# Patient Record
Sex: Female | Born: 1990 | Race: White | Hispanic: No | Marital: Married | Smoking: Never smoker
Health system: Southern US, Community
[De-identification: ages and names within clinical notes are randomized; demographics above are authoritative.]

## PROBLEM LIST (undated history)

## (undated) ENCOUNTER — Inpatient Hospital Stay (HOSPITAL_COMMUNITY): Payer: Self-pay

## (undated) HISTORY — PX: WISDOM TOOTH EXTRACTION: SHX21

---
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
## 2010-11-09 ENCOUNTER — Encounter: Payer: Self-pay | Admitting: *Deleted

## 2011-08-04 LAB — DIFFERENTIAL
Basophils Absolute: 0
Basophils Relative: 0
Eosinophils Absolute: 0.3
Eosinophils Relative: 4
Lymphocytes Relative: 26
Lymphs Abs: 1.7
Monocytes Absolute: 0.5
Monocytes Relative: 8
Neutro Abs: 3.9
Neutrophils Relative %: 61

## 2011-08-04 LAB — PREGNANCY, URINE: Preg Test, Ur: NEGATIVE

## 2011-08-04 LAB — COMPREHENSIVE METABOLIC PANEL
ALT: 13
AST: 20
Albumin: 3.9
Alkaline Phosphatase: 84
BUN: 7
CO2: 27
Calcium: 10.2
Chloride: 107
Creatinine, Ser: 0.74
Glucose, Bld: 105 — ABNORMAL HIGH
Potassium: 3.9
Sodium: 142
Total Bilirubin: 0.4
Total Protein: 6.9

## 2011-08-04 LAB — URINALYSIS, ROUTINE W REFLEX MICROSCOPIC
Bilirubin Urine: NEGATIVE
Glucose, UA: NEGATIVE
Hgb urine dipstick: NEGATIVE
Ketones, ur: NEGATIVE
Nitrite: NEGATIVE
Protein, ur: NEGATIVE
Specific Gravity, Urine: 1.015
Urobilinogen, UA: 0.2
pH: 6

## 2011-08-04 LAB — CBC
HCT: 34.8 — ABNORMAL LOW
Hemoglobin: 12.2
MCHC: 34.9
MCV: 88.4
Platelets: 284
RBC: 3.94
RDW: 11.8
WBC: 6.3

## 2011-11-08 ENCOUNTER — Emergency Department (HOSPITAL_COMMUNITY)
Admission: EM | Admit: 2011-11-08 | Discharge: 2011-11-08 | Disposition: A | Discharge status: Home / Self Care | Payer: Self-pay | Source: Home / Self Care | Attending: Emergency Medicine | Admitting: Emergency Medicine

## 2011-11-08 ENCOUNTER — Encounter (HOSPITAL_COMMUNITY): Payer: Self-pay | Admitting: *Deleted

## 2011-11-08 DIAGNOSIS — H169 Unspecified keratitis: Secondary | ICD-10-CM

## 2011-11-08 DIAGNOSIS — S0501XA Injury of conjunctiva and corneal abrasion without foreign body, right eye, initial encounter: Secondary | ICD-10-CM

## 2011-11-08 DIAGNOSIS — S058X9A Other injuries of unspecified eye and orbit, initial encounter: Secondary | ICD-10-CM

## 2011-11-08 MED ORDER — CIPROFLOXACIN HCL 0.3 % OP SOLN: 1.0000 [drp] | OPHTHALMIC | Status: AC

## 2011-11-08 MED ORDER — TETRACAINE HCL 0.5 % OP SOLN
OPHTHALMIC | Status: AC
  Filled 2011-11-08: qty 2

## 2011-11-08 MED ORDER — HYDROCODONE-ACETAMINOPHEN 5-500 MG PO TABS: 1.0000 | ORAL_TABLET | Freq: Four times a day (QID) | ORAL | Status: AC | PRN

## 2011-11-08 NOTE — ED Provider Notes (Signed)
History     CSN: [id number]  Arrival date & time 11/08/11  1243   First MD Initiated Contact with Patient 11/08/11 1422      Chief Complaint  Patient presents with  . Eye Problem    (Consider location/radiation/quality/duration/timing/severity/associated sxs/prior treatment) HPI Comments: WENT to [facility], they sent here for an eye exam" " They didn't even looked with that (points to ophthalmoscope) THEY TOLD TO COME HERE CAUSE IT COULD BE A "DEEP INFECTION" Thursday I woke up fine, but shortly after putting my contact lenses, my eyes started to turned red and noticed more irritation and redness around my eye, started to feel pressure type pain on both eyes, and light sensitivity" " Have also feltlike nauseous", The soreness and pain is worse now, I have been using like artificial tears"       Patient is a 21 y.o. female presenting with eye problem.  Eye Problem  This is a new problem. The current episode started more than 2 days ago. The problem occurs constantly. The problem has been gradually worsening. There is pain in both eyes. The injury mechanism was contact lenses. The pain is at a severity of 4/10. The pain is moderate. There is no history of trauma to the eye. There is no known exposure to pink eye. She wears contacts. Associated symptoms include foreign body sensation, photophobia, eye redness, nausea and itching. Pertinent negatives include no numbness, no decreased vision, no discharge, no double vision and no tingling. The treatment provided no relief.    History reviewed. No pertinent past medical history.  History reviewed. No pertinent past surgical history.  No family history on file.  History  Substance Use Topics  . Smoking status: Never Smoker   . Smokeless tobacco: Not on file  . Alcohol Use: No    OB History    Grav Para Term Preterm Abortions TAB SAB Ect Mult Living                  Review of Systems  Constitutional: Negative for  fever and activity change.  HENT: Negative for ear pain, rhinorrhea, neck pain and neck stiffness.   Eyes: Positive for photophobia, pain, redness and itching. Negative for double vision, discharge and visual disturbance.  Gastrointestinal: Positive for nausea. Negative for abdominal pain and diarrhea.  Skin: Positive for itching. Negative for rash.  Neurological: Negative for tingling and numbness.    Allergies  Review of patient's allergies indicates no known allergies.  Home Medications   Current Outpatient Rx  Name Route Sig Dispense Refill  . NORGESTIMATE-ETH ESTRADIOL 0.25-35 MG-MCG PO TABS Oral Take 1 tablet by mouth daily.    Marland Kitchen CIPROFLOXACIN HCL 0.3 % OP SOLN Both Eyes Place 1 drop into both eyes every 2 (two) hours. Administer 1 drop, every 2 hours, while awake, for 2 days. Then 1 drop, every 4 hours, while awake, for the next 5 days. 5 mL 0  . HYDROCODONE-ACETAMINOPHEN 5-500 MG PO TABS Oral Take 1 tablet by mouth every 6 (six) hours as needed for pain. 30 tablet 0    BP 105/63  Pulse 69  Temp(Src) 98.2 F (36.8 C) (Oral)  Resp 16  SpO2 100%  LMP 10/30/2011  Physical Exam  Nursing note and vitals reviewed. Constitutional: She is oriented to person, place, and time. She appears well-developed and well-nourished. No distress.  HENT:  Head: Atraumatic.  Eyes: EOM are normal. Pupils are equal, round, and reactive to light. No foreign bodies found.  Right eye exhibits no exudate. No foreign body present in the right eye. Left eye exhibits no discharge and no exudate. No foreign body present in the left eye. Right conjunctiva is injected. Right conjunctiva has no hemorrhage. Left conjunctiva is injected. Left conjunctiva has no hemorrhage. Pupils are equal.  Slit lamp exam:      The right eye shows corneal abrasion, corneal flare and fluorescein uptake. The right eye shows no corneal ulcer, no foreign body, no hyphema and no anterior chamber bulge.       The left eye shows  corneal abrasion, corneal flare and fluorescein uptake. The left eye shows no corneal ulcer, no foreign body, no hyphema, no hypopyon and no anterior chamber bulge.    Neck: Normal range of motion. Neck supple.  Neurological: She is alert and oriented to person, place, and time.  Skin: Skin is warm. No rash noted. No erythema.    ED Course  Procedures (including critical care time)  Labs Reviewed - No data to display No results found.   1. Bilateral corneal abrasions   2. Keratitis       MDM  Bilateral corneal opacifications, Follow-up in the ED if any worsening symptoms, for eye measuring pressure. Aggressive antibiotic treatment today.        Jimmie Molly, MD 11/08/11 [phone]

## 2011-11-08 NOTE — ED Notes (Addendum)
Reports waking up Thurs morning without any problems; after putting in contact lenses, started to notice slight redness in eye and "red ring around left eye", and noticing "black splotches".  Redness and irritation have gotten progressively worse over last couple of days; denies drainage.  C/O "a lot of pressure" and light sensitivity in bilat eyes.  Went to another urgent care and was sent here for lamp exam.

## 2011-11-10 ENCOUNTER — Telehealth (HOSPITAL_COMMUNITY): Payer: Self-pay | Admitting: *Deleted

## 2013-03-21 LAB — OB RESULTS CONSOLE HEPATITIS B SURFACE ANTIGEN: Hepatitis B Surface Ag: NEGATIVE

## 2013-03-21 LAB — OB RESULTS CONSOLE ABO/RH: RH Type: NEGATIVE

## 2013-03-21 LAB — OB RESULTS CONSOLE RUBELLA ANTIBODY, IGM: Rubella: IMMUNE

## 2013-03-21 LAB — OB RESULTS CONSOLE HIV ANTIBODY (ROUTINE TESTING): HIV: NONREACTIVE

## 2013-03-21 LAB — OB RESULTS CONSOLE RPR: RPR: NONREACTIVE

## 2013-07-15 ENCOUNTER — Inpatient Hospital Stay (HOSPITAL_COMMUNITY)
Admission: AD | Admit: 2013-07-15 | Discharge: 2013-07-15 | Disposition: A | Discharge status: Home / Self Care | Payer: Medicaid Other | Source: Ambulatory Visit | Attending: Obstetrics and Gynecology | Admitting: Obstetrics and Gynecology

## 2013-07-15 ENCOUNTER — Encounter (HOSPITAL_COMMUNITY): Payer: Self-pay | Admitting: *Deleted

## 2013-07-15 ENCOUNTER — Encounter: Payer: Self-pay | Admitting: Obstetrics and Gynecology

## 2013-07-15 DIAGNOSIS — W108XXA Fall (on) (from) other stairs and steps, initial encounter: Secondary | ICD-10-CM | POA: Insufficient documentation

## 2013-07-15 DIAGNOSIS — O99891 Other specified diseases and conditions complicating pregnancy: Secondary | ICD-10-CM | POA: Insufficient documentation

## 2013-07-15 DIAGNOSIS — Z298 Encounter for other specified prophylactic measures: Secondary | ICD-10-CM | POA: Insufficient documentation

## 2013-07-15 DIAGNOSIS — Z2989 Encounter for other specified prophylactic measures: Secondary | ICD-10-CM | POA: Insufficient documentation

## 2013-07-15 DIAGNOSIS — Y92009 Unspecified place in unspecified non-institutional (private) residence as the place of occurrence of the external cause: Secondary | ICD-10-CM | POA: Insufficient documentation

## 2013-07-15 LAB — URINALYSIS, ROUTINE W REFLEX MICROSCOPIC
Bilirubin Urine: NEGATIVE
Glucose, UA: NEGATIVE mg/dL
Hgb urine dipstick: NEGATIVE
Ketones, ur: NEGATIVE mg/dL
Leukocytes, UA: NEGATIVE
Nitrite: NEGATIVE
Protein, ur: NEGATIVE mg/dL
Specific Gravity, Urine: 1.025 (ref 1.005–1.030)
Urobilinogen, UA: 0.2 mg/dL (ref 0.0–1.0)
pH: 6 (ref 5.0–8.0)

## 2013-07-15 LAB — KLEIHAUER-BETKE STAIN
# Vials RhIg: 1
Fetal Cells %: 0 %
Quantitation Fetal Hemoglobin: 0 mL

## 2013-07-15 MED ORDER — IBUPROFEN 800 MG PO TABS: 800.0000 mg | ORAL_TABLET | Freq: Three times a day (TID) | ORAL | Status: DC | PRN

## 2013-07-15 MED ORDER — RHO D IMMUNE GLOBULIN 1500 UNIT/2ML IJ SOLN
300.0000 ug | Freq: Once | INTRAMUSCULAR | Status: AC
  Administered 2013-07-15: 300 ug via INTRAMUSCULAR
  Filled 2013-07-15: qty 2

## 2013-07-15 NOTE — MAU Note (Signed)
Patient presents to [facility] after having a fall this am at 8:25 down 7 steps. Patient denies hitting abdomen. Denies bleeding, LOF, or contractions. Reports good fetal movement.

## 2013-07-15 NOTE — Consult Note (Signed)
DATE: 07/15/2013  Maternity Admissions Unit History and Physical Exam for an Obstetrics Patient  Ms. Karen Bishop is a 22 y.o. female, G1P0, at [redacted]w[redacted]d gestation, who presents for evaluation after falling on the stairs. The patient reports that she fell on her right hip. She also hit her elbow on the stairs. She did not hit her head or her abdomen. She denies bleeding and leakage of fluid. She denies uterine contractions. She does not complain of headaches. She does not think that any bones are broken. She has been followed at the [facility].  See history below.  OB History   Grav Para Term Preterm Abortions TAB SAB Ect Mult Living   1               History reviewed. No pertinent past medical history.  Prescriptions prior to admission  Medication Sig Dispense Refill  . Prenatal Vit-Fe Fumarate-FA (PRENATAL MULTIVITAMIN) TABS tablet Take 1 tablet by mouth daily at 12 noon.        Past Surgical History  Procedure Laterality Date  . Wisdom tooth extraction      No Known Allergies  Family History: family history includes Mental illness in her mother.  Social History:  reports that she has never smoked. She does not have any smokeless tobacco history on file. She reports that she does not drink alcohol or use illicit drugs.  Review of systems: Normal pregnancy complaints.  Admission Physical Exam:  Dilation: Closed Exam by:: Dr. Stefano Gaul Body mass index is 30.89 kg/(m^2).  Blood pressure 107/58, pulse 75, temperature 98 F (36.7 C), temperature source Oral, resp. rate 18, height 5\' 1"  (1.549 m), weight 163 lb 6.4 oz (74.118 kg), last menstrual period 01/23/2013, SpO2 100.00%.  HEENT:                 Within normal limits Chest:                   Clear Heart:                    Regular rate and rhythm Abdomen:             Gravid and nontender Extremities:          Grossly normal Neurologic exam: Grossly  normal Pelvic exam:         Cervix: Closed and long, no fluid, no blood. Back:                    Nontender.  Prenatal labs: ABO, Rh:             --/--/O NEG (09/26 0981) Antibody:              NEG (09/26 0918) Rubella:                  RPR:                       HBsAg:                    HIV:                          GBS:                        NST: Category stable fetal heart tone; Contractions:  None .   Assessment:  [redacted]w[redacted]d gestation  The patient fell down the stairs. No evidence of trauma to mother or baby.  Plan:  I will give the patient RhoGAM today because she will receive RhoGAM anyway in 3 weeks.  The patient will call for questions or concerns.  Fetal kick counts will be done.  Return office in one week.  A note was given to the patient so that she can take the day off from work. She can take Motrin 800 mg every 8 hours as needed for discomfort.   [name] V 07/15/2013, 10:26 AM

## 2013-07-16 LAB — RH IG WORKUP (INCLUDES ABO/RH)
ABO/RH(D): O NEG
Antibody Screen: NEGATIVE
Gestational Age(Wks): 24.5
Unit division: 0

## 2013-10-08 LAB — OB RESULTS CONSOLE GBS: GBS: NEGATIVE

## 2013-10-08 LAB — OB RESULTS CONSOLE GC/CHLAMYDIA
Chlamydia: NEGATIVE
Gonorrhea: NEGATIVE

## 2013-10-20 NOTE — L&D Delivery Note (Signed)
Delivery Note Pt progressed quickly and at 1800 pt with involuntary urge to push.  At 6:18 PM a viable female was delivered via Vaginal, Spontaneous Delivery (Presentation: Left Occiput Anterior). Nuchal cord x 1, easily reduced.  No difficulty with shoulders. Infant with spontaneous cry.  Infant dried, stimulated and placed on maternal chest.  Cord doubly clamped after cessation of pulsation.  Cord cut by FOB.   APGAR: 8, 9; weight 7# 10oz .   Placenta status: Intact, Spontaneous.  Cord: 3 vessels with the following complications: None.  Cord pH: N/A  Anesthesia: 1% Xylocaine local for perineal repair Episiotomy: None Lacerations: 2nd degree;Perineal Suture Repair: 3.0 monocryl Est. Blood Loss (mL): 350ml  Mom to postpartum.  Baby to Couplet care / Skin to Skin.  Karen Bishop O. 11/05/2013, 7:23 PM

## 2013-11-04 ENCOUNTER — Encounter (HOSPITAL_COMMUNITY): Payer: Self-pay | Admitting: *Deleted

## 2013-11-04 ENCOUNTER — Inpatient Hospital Stay (HOSPITAL_COMMUNITY)
Admission: AD | Admit: 2013-11-04 | Discharge: 2013-11-04 | Disposition: A | Discharge status: Home / Self Care | Payer: Medicaid Other | Source: Ambulatory Visit | Attending: Obstetrics and Gynecology | Admitting: Obstetrics and Gynecology

## 2013-11-04 DIAGNOSIS — O479 False labor, unspecified: Secondary | ICD-10-CM | POA: Insufficient documentation

## 2013-11-04 MED ORDER — ZOLPIDEM TARTRATE 10 MG PO TABS: 15.0000 mg | ORAL_TABLET | Freq: Every evening | ORAL | Status: DC | PRN

## 2013-11-04 NOTE — Discharge Instructions (Signed)
Fetal Movement Counts °Patient Name: __________________________________________________ Patient Due Date: ____________________ °Performing a fetal movement count is highly recommended in high-risk pregnancies, but it is good for every pregnant woman to do. Your caregiver may ask you to start counting fetal movements at 28 weeks of the pregnancy. Fetal movements often increase: °· After eating a full meal. °· After physical activity. °· After eating or drinking something sweet or cold. °· At rest. °Pay attention to when you feel the baby is most active. This will help you notice a pattern of your baby's sleep and wake cycles and what factors contribute to an increase in fetal movement. It is important to perform a fetal movement count at the same time each day when your baby is normally most active.  °HOW TO COUNT FETAL MOVEMENTS °1. Find a quiet and comfortable area to sit or lie down on your left side. Lying on your left side provides the best blood and oxygen circulation to your baby. °2. Write down the day and time on a sheet of paper or in a journal. °3. Start counting kicks, flutters, swishes, rolls, or jabs in a 2 hour period. You should feel at least 10 movements within 2 hours. °4. If you do not feel 10 movements in 2 hours, wait 2 3 hours and count again. Look for a change in the pattern or not enough counts in 2 hours. °SEEK MEDICAL CARE IF: °· You feel less than 10 counts in 2 hours, tried twice. °· There is no movement in over an hour. °· The pattern is changing or taking longer each day to reach 10 counts in 2 hours. °· You feel the baby is not moving as he or she usually does. °Date: ____________ Movements: ____________ Start time: ____________ Finish time: ____________  °Date: ____________ Movements: ____________ Start time: ____________ Finish time: ____________ °Date: ____________ Movements: ____________ Start time: ____________ Finish time: ____________ °Date: ____________ Movements: ____________  Start time: ____________ Finish time: ____________ °Date: ____________ Movements: ____________ Start time: ____________ Finish time: ____________ °Date: ____________ Movements: ____________ Start time: ____________ Finish time: ____________ °Date: ____________ Movements: ____________ Start time: ____________ Finish time: ____________ °Date: ____________ Movements: ____________ Start time: ____________ Finish time: ____________  °Date: ____________ Movements: ____________ Start time: ____________ Finish time: ____________ °Date: ____________ Movements: ____________ Start time: ____________ Finish time: ____________ °Date: ____________ Movements: ____________ Start time: ____________ Finish time: ____________ °Date: ____________ Movements: ____________ Start time: ____________ Finish time: ____________ °Date: ____________ Movements: ____________ Start time: ____________ Finish time: ____________ °Date: ____________ Movements: ____________ Start time: ____________ Finish time: ____________ °Date: ____________ Movements: ____________ Start time: ____________ Finish time: ____________  °Date: ____________ Movements: ____________ Start time: ____________ Finish time: ____________ °Date: ____________ Movements: ____________ Start time: ____________ Finish time: ____________ °Date: ____________ Movements: ____________ Start time: ____________ Finish time: ____________ °Date: ____________ Movements: ____________ Start time: ____________ Finish time: ____________ °Date: ____________ Movements: ____________ Start time: ____________ Finish time: ____________ °Date: ____________ Movements: ____________ Start time: ____________ Finish time: ____________ °Date: ____________ Movements: ____________ Start time: ____________ Finish time: ____________  °Date: ____________ Movements: ____________ Start time: ____________ Finish time: ____________ °Date: ____________ Movements: ____________ Start time: ____________ Finish time:  ____________ °Date: ____________ Movements: ____________ Start time: ____________ Finish time: ____________ °Date: ____________ Movements: ____________ Start time: ____________ Finish time: ____________ °Date: ____________ Movements: ____________ Start time: ____________ Finish time: ____________ °Date: ____________ Movements: ____________ Start time: ____________ Finish time: ____________ °Date: ____________ Movements: ____________ Start time: ____________ Finish time: ____________  °Date: ____________ Movements: ____________ Start time: ____________ Finish   time: ____________ °Date: ____________ Movements: ____________ Start time: ____________ Finish time: ____________ °Date: ____________ Movements: ____________ Start time: ____________ Finish time: ____________ °Date: ____________ Movements: ____________ Start time: ____________ Finish time: ____________ °Date: ____________ Movements: ____________ Start time: ____________ Finish time: ____________ °Date: ____________ Movements: ____________ Start time: ____________ Finish time: ____________ °Date: ____________ Movements: ____________ Start time: ____________ Finish time: ____________  °Date: ____________ Movements: ____________ Start time: ____________ Finish time: ____________ °Date: ____________ Movements: ____________ Start time: ____________ Finish time: ____________ °Date: ____________ Movements: ____________ Start time: ____________ Finish time: ____________ °Date: ____________ Movements: ____________ Start time: ____________ Finish time: ____________ °Date: ____________ Movements: ____________ Start time: ____________ Finish time: ____________ °Date: ____________ Movements: ____________ Start time: ____________ Finish time: ____________ °Date: ____________ Movements: ____________ Start time: ____________ Finish time: ____________  °Date: ____________ Movements: ____________ Start time: ____________ Finish time: ____________ °Date: ____________ Movements:  ____________ Start time: ____________ Finish time: ____________ °Date: ____________ Movements: ____________ Start time: ____________ Finish time: ____________ °Date: ____________ Movements: ____________ Start time: ____________ Finish time: ____________ °Date: ____________ Movements: ____________ Start time: ____________ Finish time: ____________ °Date: ____________ Movements: ____________ Start time: ____________ Finish time: ____________ °Date: ____________ Movements: ____________ Start time: ____________ Finish time: ____________  °Date: ____________ Movements: ____________ Start time: ____________ Finish time: ____________ °Date: ____________ Movements: ____________ Start time: ____________ Finish time: ____________ °Date: ____________ Movements: ____________ Start time: ____________ Finish time: ____________ °Date: ____________ Movements: ____________ Start time: ____________ Finish time: ____________ °Date: ____________ Movements: ____________ Start time: ____________ Finish time: ____________ °Date: ____________ Movements: ____________ Start time: ____________ Finish time: ____________ °Document Released: 11/05/2006 Document Revised: 09/22/2012 Document Reviewed: 08/02/2012 °ExitCare® Patient Information ©2014 [employer]. ° ° °Braxton Hicks Contractions °Pregnancy is commonly associated with contractions of the uterus throughout the pregnancy. Towards the end of pregnancy (32 to 34 weeks), these contractions (Braxton Hicks) can develop more often and may become more forceful. This is not true labor because these contractions do not result in opening (dilatation) and thinning of the cervix. They are sometimes difficult to tell apart from true labor because these contractions can be forceful and people have different pain tolerances. You should not feel embarrassed if you go to the hospital with false labor. Sometimes, the only way to tell if you are in true labor is for your caregiver to follow the changes  in the cervix. °How to tell the difference between true and false labor: °· False labor. °· The contractions of false labor are usually shorter, irregular and not as hard as those of true labor. °· They are often felt in the front of the lower abdomen and in the groin. °· They may leave with walking around or changing positions while lying down. °· They get weaker and are shorter lasting as time goes on. °· These contractions are usually irregular. °· They do not usually become progressively stronger, regular and closer together as with true labor. °· True labor. °· Contractions in true labor last 30 to 70 seconds, become very regular, usually become more intense, and increase in frequency. °· They do not go away with walking. °· The discomfort is usually felt in the top of the uterus and spreads to the lower abdomen and low back. °· True labor can be determined by your caregiver with an exam. This will show that the cervix is dilating and getting thinner. °If there are no prenatal problems or other health problems associated with the pregnancy, it is completely safe to be sent home with false labor and await the onset of   true labor. °HOME CARE INSTRUCTIONS  °· Keep up with your usual exercises and instructions. °· Take medications as directed. °· Keep your regular prenatal appointment. °· Eat and drink lightly if you think you are going into labor. °· If BH contractions are making you uncomfortable: °· Change your activity position from lying down or resting to walking/walking to resting. °· Sit and rest in a tub of warm water. °· Drink 2 to 3 glasses of water. Dehydration may cause B-H contractions. °· Do slow and deep breathing several times an hour. °SEEK IMMEDIATE MEDICAL CARE IF:  °· Your contractions continue to become stronger, more regular, and closer together. °· You have a gushing, burst or leaking of fluid from the vagina. °· An oral temperature above 102° F (38.9° C) develops. °· You have passage of  blood-tinged mucus. °· You develop vaginal bleeding. °· You develop continuous belly (abdominal) pain. °· You have low back pain that you never had before. °· You feel the baby's head pushing down causing pelvic pressure. °· The baby is not moving as much as it used to. °Document Released: 10/06/2005 Document Revised: 12/29/2011 Document Reviewed: 07/18/2013 °ExitCare® Patient Information ©2014 [employer]. ° ° °

## 2013-11-04 NOTE — MAU Note (Signed)
PT SAYS SHE HURT BAD AT 4PM.  VE IN OFFICE 1 CM.   DENIES HSV AND  MRSA

## 2013-11-04 NOTE — MAU Provider Note (Signed)
  History     CSN: [id number]  Arrival date and time: 11/04/13 0031   None     No chief complaint on file.  HPI Comments: Pt is a G1Po at 6371w5d arrives unannounced w c/o ctx that started this afternoon and are now feeling stronger, denies any VB or LOF, reports GFM.       History reviewed. No pertinent past medical history.  Past Surgical History  Procedure Laterality Date  . Wisdom tooth extraction      Family History  Problem Relation Age of Onset  . Mental illness Mother     bipolar    History  Substance Use Topics  . Smoking status: Never Smoker   . Smokeless tobacco: Not on file  . Alcohol Use: No    Allergies: No Known Allergies  Prescriptions prior to admission  Medication Sig Dispense Refill  . Prenatal Vit-Fe Fumarate-FA (PRENATAL MULTIVITAMIN) TABS tablet Take 1 tablet by mouth daily at 12 noon.      Marland Kitchen. ibuprofen (ADVIL,MOTRIN) 800 MG tablet Take 1 tablet (800 mg total) by mouth every 8 (eight) hours as needed for pain.  50 tablet  1    Review of Systems  All other systems reviewed and are negative.   Physical Exam   Blood pressure 134/87, pulse 65, temperature 98.1 F (36.7 C), temperature source Oral, resp. rate 20, height 5\' 1"  (1.549 m), weight 196 lb 2 oz (88.962 kg), last menstrual period 01/23/2013.  Physical Exam  Nursing note and vitals reviewed. Constitutional: She is oriented to person, place, and time. She appears well-developed and well-nourished. No distress.  HENT:  Head: Normocephalic.  Eyes: Pupils are equal, round, and reactive to light.  Neck: Normal range of motion.  Cardiovascular: Normal rate.   Respiratory: Effort normal.  GI: Soft.  Genitourinary: Vagina normal.  Musculoskeletal: Normal range of motion.  Neurological: She is alert and oriented to person, place, and time. She has normal reflexes.  Skin: Skin is warm and dry.  Psychiatric: She has a normal mood and affect. Her behavior is normal.    MAU Course   Procedures   Assessment and Plan  IUP at 2971w5d Not in labor, declines IOL, hoping for  non-interventive birth  FHR cat 1 toco initially 2-3, with spacing out, palpating mild and pt coping well Declines any pain meds rx for ambien 10mg  PO qhs prn, pt did not want to take here,   Cervix unchanged from office after 2 exams 1/50/-3  [facility] s[facility] Keep f/u next week for BPP   Marilyne Haseley M 11/04/2013, 2:17 AM

## 2013-11-05 ENCOUNTER — Inpatient Hospital Stay (HOSPITAL_COMMUNITY)
Admission: AD | Admit: 2013-11-05 | Discharge: 2013-11-07 | DRG: 775 | Disposition: A | Discharge status: Home / Self Care | Payer: Medicaid Other | Source: Ambulatory Visit | Attending: Obstetrics and Gynecology | Admitting: Obstetrics and Gynecology

## 2013-11-05 ENCOUNTER — Encounter (HOSPITAL_COMMUNITY): Payer: Self-pay | Admitting: *Deleted

## 2013-11-05 ENCOUNTER — Inpatient Hospital Stay (HOSPITAL_COMMUNITY): Payer: Medicaid Other

## 2013-11-05 DIAGNOSIS — O9903 Anemia complicating the puerperium: Secondary | ICD-10-CM | POA: Diagnosis not present

## 2013-11-05 DIAGNOSIS — D649 Anemia, unspecified: Secondary | ICD-10-CM | POA: Diagnosis not present

## 2013-11-05 DIAGNOSIS — I10 Essential (primary) hypertension: Secondary | ICD-10-CM

## 2013-11-05 LAB — PROTEIN / CREATININE RATIO, URINE
Creatinine, Urine: 52.67 mg/dL
Protein Creatinine Ratio: 0.1 (ref 0.00–0.15)
Total Protein, Urine: 5.2 mg/dL

## 2013-11-05 LAB — COMPREHENSIVE METABOLIC PANEL
ALT: 26 U/L (ref 0–35)
AST: 29 U/L (ref 0–37)
Albumin: 3 g/dL — ABNORMAL LOW (ref 3.5–5.2)
Alkaline Phosphatase: 113 U/L (ref 39–117)
BILIRUBIN TOTAL: 0.2 mg/dL — AB (ref 0.3–1.2)
BUN: 9 mg/dL (ref 6–23)
CALCIUM: 9.1 mg/dL (ref 8.4–10.5)
CHLORIDE: 103 meq/L (ref 96–112)
CO2: 19 meq/L (ref 19–32)
Creatinine, Ser: 0.76 mg/dL (ref 0.50–1.10)
Glucose, Bld: 100 mg/dL — ABNORMAL HIGH (ref 70–99)
Potassium: 4.2 mEq/L (ref 3.7–5.3)
SODIUM: 137 meq/L (ref 137–147)
Total Protein: 6 g/dL (ref 6.0–8.3)

## 2013-11-05 LAB — URIC ACID: Uric Acid, Serum: 6.9 mg/dL (ref 2.4–7.0)

## 2013-11-05 LAB — CBC
HEMATOCRIT: 31.4 % — AB (ref 36.0–46.0)
HEMOGLOBIN: 11 g/dL — AB (ref 12.0–15.0)
MCH: 32.3 pg (ref 26.0–34.0)
MCHC: 35 g/dL (ref 30.0–36.0)
MCV: 92.1 fL (ref 78.0–100.0)
Platelets: 147 10*3/uL — ABNORMAL LOW (ref 150–400)
RBC: 3.41 MIL/uL — ABNORMAL LOW (ref 3.87–5.11)
RDW: 12.6 % (ref 11.5–15.5)
WBC: 10 10*3/uL (ref 4.0–10.5)

## 2013-11-05 LAB — TYPE AND SCREEN
ABO/RH(D): O NEG
Antibody Screen: NEGATIVE

## 2013-11-05 LAB — LACTATE DEHYDROGENASE: LDH: 200 U/L (ref 94–250)

## 2013-11-05 LAB — RPR: RPR Ser Ql: NONREACTIVE

## 2013-11-05 MED ORDER — SIMETHICONE 80 MG PO CHEW: 80.0000 mg | CHEWABLE_TABLET | ORAL | Status: DC | PRN

## 2013-11-05 MED ORDER — TETANUS-DIPHTH-ACELL PERTUSSIS 5-2.5-18.5 LF-MCG/0.5 IM SUSP: 0.5000 mL | Freq: Once | INTRAMUSCULAR | Status: DC

## 2013-11-05 MED ORDER — WITCH HAZEL-GLYCERIN EX PADS: 1.0000 "application " | MEDICATED_PAD | CUTANEOUS | Status: DC | PRN

## 2013-11-05 MED ORDER — DIBUCAINE 1 % RE OINT: 1.0000 "application " | TOPICAL_OINTMENT | RECTAL | Status: DC | PRN

## 2013-11-05 MED ORDER — IBUPROFEN 600 MG PO TABS
600.0000 mg | ORAL_TABLET | Freq: Four times a day (QID) | ORAL | Status: DC
Start: 2013-11-06 — End: 2013-11-07
  Administered 2013-11-06 – 2013-11-07 (×6): 600 mg via ORAL
  Filled 2013-11-05 (×6): qty 1

## 2013-11-05 MED ORDER — FENTANYL CITRATE 0.05 MG/ML IJ SOLN
100.0000 ug | INTRAMUSCULAR | Status: DC | PRN
  Administered 2013-11-05 (×2): 100 ug via INTRAVENOUS
  Filled 2013-11-05: qty 2

## 2013-11-05 MED ORDER — DIPHENHYDRAMINE HCL 25 MG PO CAPS: 25.0000 mg | ORAL_CAPSULE | Freq: Four times a day (QID) | ORAL | Status: DC | PRN

## 2013-11-05 MED ORDER — OXYCODONE-ACETAMINOPHEN 5-325 MG PO TABS: 1.0000 | ORAL_TABLET | ORAL | Status: DC | PRN

## 2013-11-05 MED ORDER — ONDANSETRON HCL 4 MG PO TABS: 4.0000 mg | ORAL_TABLET | ORAL | Status: DC | PRN

## 2013-11-05 MED ORDER — ONDANSETRON HCL 4 MG/2ML IJ SOLN
4.0000 mg | Freq: Four times a day (QID) | INTRAMUSCULAR | Status: DC | PRN
Start: 2013-11-05 — End: 2013-11-07

## 2013-11-05 MED ORDER — IBUPROFEN 600 MG PO TABS
600.0000 mg | ORAL_TABLET | Freq: Four times a day (QID) | ORAL | Status: DC | PRN
Start: 2013-11-05 — End: 2013-11-07
  Administered 2013-11-05: 600 mg via ORAL
  Filled 2013-11-05: qty 1

## 2013-11-05 MED ORDER — ACETAMINOPHEN 325 MG PO TABS: 650.0000 mg | ORAL_TABLET | ORAL | Status: DC | PRN

## 2013-11-05 MED ORDER — ZOLPIDEM TARTRATE 5 MG PO TABS: 5.0000 mg | ORAL_TABLET | Freq: Every evening | ORAL | Status: DC | PRN

## 2013-11-05 MED ORDER — LANOLIN HYDROUS EX OINT: TOPICAL_OINTMENT | CUTANEOUS | Status: DC | PRN

## 2013-11-05 MED ORDER — LACTATED RINGERS IV SOLN: 500.0000 mL | INTRAVENOUS | Status: DC | PRN

## 2013-11-05 MED ORDER — SENNOSIDES-DOCUSATE SODIUM 8.6-50 MG PO TABS
2.0000 | ORAL_TABLET | ORAL | Status: DC
Start: 2013-11-06 — End: 2013-11-07
  Administered 2013-11-07: 2 via ORAL
  Filled 2013-11-05: qty 2

## 2013-11-05 MED ORDER — ONDANSETRON HCL 4 MG/2ML IJ SOLN: 4.0000 mg | INTRAMUSCULAR | Status: DC | PRN

## 2013-11-05 MED ORDER — FENTANYL CITRATE 0.05 MG/ML IJ SOLN
INTRAMUSCULAR | Status: AC
  Filled 2013-11-05: qty 2

## 2013-11-05 MED ORDER — NALBUPHINE HCL 10 MG/ML IJ SOLN: 5.0000 mg | INTRAMUSCULAR | Status: DC | PRN

## 2013-11-05 MED ORDER — PRENATAL MULTIVITAMIN CH
1.0000 | ORAL_TABLET | Freq: Every day | ORAL | Status: DC
  Administered 2013-11-06 – 2013-11-07 (×2): 1 via ORAL
  Filled 2013-11-05 (×2): qty 1

## 2013-11-05 MED ORDER — OXYTOCIN BOLUS FROM INFUSION
500.0000 mL | INTRAVENOUS | Status: DC
Start: 2013-11-05 — End: 2013-11-07

## 2013-11-05 MED ORDER — CITRIC ACID-SODIUM CITRATE 334-500 MG/5ML PO SOLN: 30.0000 mL | ORAL | Status: DC | PRN

## 2013-11-05 MED ORDER — OXYTOCIN 40 UNITS IN LACTATED RINGERS INFUSION - SIMPLE MED: 62.5000 mL/h | INTRAVENOUS | Status: DC

## 2013-11-05 MED ORDER — TERBUTALINE SULFATE 1 MG/ML IJ SOLN: 0.2500 mg | Freq: Once | INTRAMUSCULAR | Status: AC | PRN

## 2013-11-05 MED ORDER — BENZOCAINE-MENTHOL 20-0.5 % EX AERO
1.0000 "application " | INHALATION_SPRAY | CUTANEOUS | Status: DC | PRN
  Filled 2013-11-05: qty 56

## 2013-11-05 MED ORDER — LIDOCAINE HCL (PF) 1 % IJ SOLN
30.0000 mL | INTRAMUSCULAR | Status: DC | PRN
  Administered 2013-11-05: 30 mL via SUBCUTANEOUS
  Filled 2013-11-05 (×2): qty 30

## 2013-11-05 MED ORDER — FLEET ENEMA 7-19 GM/118ML RE ENEM: 1.0000 | ENEMA | RECTAL | Status: DC | PRN

## 2013-11-05 MED ORDER — LACTATED RINGERS IV SOLN
INTRAVENOUS | Status: DC
  Administered 2013-11-05: 15:00:00 via INTRAVENOUS

## 2013-11-05 MED ORDER — OXYTOCIN 40 UNITS IN LACTATED RINGERS INFUSION - SIMPLE MED
1.0000 m[IU]/min | INTRAVENOUS | Status: DC
  Administered 2013-11-05: 1 m[IU]/min via INTRAVENOUS
  Administered 2013-11-05: 999 m[IU]/min via INTRAVENOUS
  Filled 2013-11-05: qty 1000

## 2013-11-05 NOTE — Progress Notes (Signed)
Karen DodgeJeannie M Bishop is a 23 y.o. G1P0 at 6519w6d  Subjective: Called to see pt due to increased pain with contractions and pt now wishes to discuss pain management.   Objective: BP 144/76  Pulse 80  Temp(Src) 97.8 F (36.6 C) (Oral)  Resp 20  Ht 5\' 1"  (1.549 m)  Wt 88.905 kg (196 lb)  BMI 37.05 kg/m2  LMP 01/23/2013  FHT:  FHR: 145 bpm, variability: moderate,  accelerations:  Present,  decelerations:  Present Occas intermittent brief variable and occas early decel. UC:   regular, every 2-3 minutes. Pitocin at 538mu/min.  SVE:   Dilation: 3.5 Effacement (%): 90 Station: -3 BBOW Exam by:: N.Meldon Hanzlik, CNM SROM immediately after SVE with sm amt lt mec stained fluid.  Labs: Lab Results  Component Value Date   WBC 10.0 11/05/2013   HGB 11.0* 11/05/2013   HCT 31.4* 11/05/2013   MCV 92.1 11/05/2013   PLT 147* 11/05/2013    Assessment / Plan: IUP at 40w 6d Prolonged latent labor with pitocin augmentation  Labor:  Latent labor Preeclampsia:  no signs or symptoms of toxicity Fetal Wellbeing:  Category II Pain Control:  Pt considering options I/D:  n/a Anticipated MOD:  NSVD  RBA pain relief option d/w pt including IV pain medications, epidural or continuing with other comfort measures/position changes.   Pt desires to proceed with fentanyl IV at present.    [name] 11/05/2013, 5:09 PM

## 2013-11-05 NOTE — Progress Notes (Signed)
Provider notified of UCs and FHR changed--updated that pitocin was decreased to 344milliunits--orders to continue to assess

## 2013-11-05 NOTE — MAU Note (Signed)
NSmith, CNM at bedside.

## 2013-11-05 NOTE — Progress Notes (Signed)
Karen LawsJennifer Bishop CNM notified of pt's admission and FHR tracing with reactivity and variable. Will see pt

## 2013-11-05 NOTE — Progress Notes (Signed)
Pt up to BR

## 2013-11-05 NOTE — Progress Notes (Signed)
Haroldine LawsJennifer Oxley CNM notified of BPP of 8/8. OK for pt to have crackers.

## 2013-11-05 NOTE — H&P (Signed)
Karen Bishop is a 23 y.o. female presenting for early labor at 40w 6d. Maternal Medical History:    Pt presented to MAU during early morning hours with c/o of increased contraction frequency and intensity.  Contractions now slightly less frequent but more intense.  FHR upon presentation with variable decel to 70bpm x 3 mins.  BPP obtained with results of 8/8 in 29mins.  Pt states she is exhausted due to latent/prodromal labor over the past 60hrs.  Was also seen in MAU on 11/03/13 as well.  Reports active fetus.  Denies ROM or bldg.  Denies headache, vision chgs, RUQ pain.  Reports her blood pressure has been noted to be slightly elevated here in MAU with her last few visits. Previous SVE in MAU earlier this AM 1.5/70/-2  OB History   Grav Para Term Preterm Abortions TAB SAB Ect Mult Living   1              Hx Present Preg: Pt entered care at 4732w4d.  Pt declined 1st trim screen and elected quad screen.  MSAFP only obtained and no increased risk for NTD.  Ultrasound at 18wks c/w dates and no anatomic abnormalities identified.  Pt recvd rhogam at 28wks.  1hr GTT WNL.  Pt with c/o slight spotting at 30 and 32wks with closed cervix and no known etiology and spontaneous resolution.  Reported decreased fetal movement on 1/12 and eval with BPP and NST reassuring fetal testing.    History reviewed. No pertinent past medical history. Past Surgical History  Procedure Laterality Date  . Wisdom tooth extraction     Family History: family history includes Mental illness in her mother. Social History:  reports that she has never smoked. She does not have any smokeless tobacco history on file. She reports that she does not drink alcohol or use illicit drugs.   Prenatal Transfer Tool  Maternal Diabetes: No Genetic Screening: Normal-MSAFP only.  Declined 1st trim screen.  Quad screen not done Maternal Ultrasounds/Referrals: Normal Fetal Ultrasounds or other Referrals:  None Maternal Substance Abuse:   No Significant Maternal Medications:  None Significant Maternal Lab Results:  None Other Comments:  None  Review of Systems  Constitutional: Negative.   HENT: Negative.   Eyes: Negative.   Respiratory: Negative.   Cardiovascular: Negative.   Gastrointestinal: Negative.   Genitourinary: Negative.   Musculoskeletal: Negative.   Skin: Negative.   Neurological: Negative.   Endo/Heme/Allergies: Negative.   Psychiatric/Behavioral: Negative.     Dilation: 2 Effacement (%): 80 Station: -3 Exam by:: Karen Bishop. Karen Bishop, CNM Blood pressure 140/87, pulse 73, temperature 97.8 F (36.6 C), resp. rate 20, height 5\' 1"  (1.549 m), weight 88.905 kg (196 lb), last menstrual period 01/23/2013. Maternal Exam:  Uterine Assessment: Contraction strength is moderate.  Contraction frequency is irregular.   Abdomen: Fundal height is 40.   Estimated fetal weight is 7.5#.   Fetal presentation: vertex  Introitus: Normal vulva. Normal vagina.  Ferning test: not done.  Nitrazine test: not done.  Pelvis: adequate for delivery.   Cervix: Cervix evaluated by digital exam.     Fetal Exam Fetal Monitor Review: Mode: ultrasound.   Baseline rate: 130.  Variability: moderate (6-25 bpm).   Pattern: accelerations present and variable decelerations.   Intermittent brief variable decel noted.    Fetal State Assessment: Category II - tracings are indeterminate.     Physical Exam  Nursing note and vitals reviewed. Constitutional: She is oriented to person, place, and time. She appears well-developed  and well-nourished.  HENT:  Head: Normocephalic and atraumatic.  Right Ear: External ear normal.  Left Ear: External ear normal.  Nose: Nose normal.  Eyes: Conjunctivae are normal. Pupils are equal, round, and reactive to light.  Neck: Normal range of motion. Neck supple. No thyromegaly present.  Cardiovascular: Normal rate, regular rhythm and intact distal pulses.   Respiratory: Effort normal and breath sounds  normal.  GI: Soft. Bowel sounds are normal. There is no tenderness.  Gravid with FH 40cm  Genitourinary:  Spec exam deferred.  Ext gent WNL.  BUS neg.    Musculoskeletal: Normal range of motion.  Neurological: She is alert and oriented to person, place, and time. She has normal reflexes.  Skin: Skin is warm and dry.  Psychiatric: She has a normal mood and affect. Her behavior is normal.    Prenatal labs: ABO, Rh: --/--/O NEG (09/26 1610) Antibody: NEG (09/26 9604) Rubella: Immune (06/02 0000) RPR: Nonreactive (06/02 0000)  HBsAg: Negative (06/02 0000)  HIV: Non-reactive (06/02 0000)  GBS: Negative (12/20 0000)   Assessment/Plan: IUP at 40w 6d  Prolonged latent labor  Elevated blood pressure   Admit to [facility] per consult with Dr. Eloise Levels augmentation of labor with pitocin d/w pt and she desires to proceed.  Will check PIH labs and protein/creat ratio to rule out preeclampsia.       Carlee Tesfaye O. 11/05/2013, 10:04 AM

## 2013-11-05 NOTE — Progress Notes (Signed)
Karen LawsJennifer Oxley CNM in to see pt

## 2013-11-05 NOTE — Progress Notes (Signed)
Karen LawsJennifer Bishop CNM

## 2013-11-05 NOTE — MAU Note (Signed)
[facility]

## 2013-11-05 NOTE — MAU Note (Signed)
Contractions since Thursday. Was seen MAU and sent home. Contractions stronger now. Took Ambien at 0230. Some bloody show

## 2013-11-06 DIAGNOSIS — I10 Essential (primary) hypertension: Secondary | ICD-10-CM | POA: Diagnosis present

## 2013-11-06 LAB — CBC
HEMATOCRIT: 26.9 % — AB (ref 36.0–46.0)
Hemoglobin: 9.4 g/dL — ABNORMAL LOW (ref 12.0–15.0)
MCH: 32.4 pg (ref 26.0–34.0)
MCHC: 34.9 g/dL (ref 30.0–36.0)
MCV: 92.8 fL (ref 78.0–100.0)
PLATELETS: 129 10*3/uL — AB (ref 150–400)
RBC: 2.9 MIL/uL — AB (ref 3.87–5.11)
RDW: 12.7 % (ref 11.5–15.5)
WBC: 10.2 10*3/uL (ref 4.0–10.5)

## 2013-11-06 MED ORDER — POLYSACCHARIDE IRON COMPLEX 150 MG PO CAPS
150.0000 mg | ORAL_CAPSULE | Freq: Every day | ORAL | Status: DC
  Administered 2013-11-07: 150 mg via ORAL
  Filled 2013-11-06 (×2): qty 1

## 2013-11-06 NOTE — Progress Notes (Signed)
Patient ID: Karen Bishop Bishop, female   DOB: 1991/08/30, 23 y.o.   MRN: [id number] Post Partum Day 1  Subjective: no complaints, up ad lib without syncope, voiding, tolerating PO,  Pain well controlled with po meds,  BF good  Mood stable, bonding well Contraception: condoms    Objective: Blood pressure 109/78, pulse 79, temperature 97.8 F (36.6 C), temperature source Oral, resp. rate 18, height 5\' 1"  (1.549 Bishop), weight 196 lb (88.905 kg), last menstrual period 01/23/2013, SpO2 98.00%, unknown if currently breastfeeding.  Physical Exam:  General: NAD  Lochia: appropriate Uterine Fundus: firm Perineum: healing  DVT Evaluation: No evidence of DVT seen on physical exam. Negative Homan's sign. No significant calf/ankle edema.   Recent Labs  11/05/13 0917 11/06/13 0606  HGB 11.0* 9.4*  HCT 31.4* 26.9*    Assessment/Plan: Plan for discharge tomorrow, Breastfeeding, Lactation consult and Contraception condoms Circumcision plans outpatient Mild anemia, FE supplement       LOS: 1 day   Karen Bishop 11/06/2013, 9:28 AM

## 2013-11-06 NOTE — Progress Notes (Signed)
Karen Bishop is a 23 y.o. G1P0 admitted at 53w 6d  Subjective: Pt now on [employer].  Reports contractions remain irregular but are intense when they occur.  No ROM or bldg.  Objective: BP 133/86  Pulse 83  Temp(Src) 97.8 F (36.6 C) (Oral)  Resp 18  Ht _0  (1.549 m)  Wt 88.905 kg (196 lb)  BMI 37.05 kg/m2  LMP 01/23/2013  FHT:  FHR: 125 bpm, variability: moderate,  accelerations:  Present,  decelerations:  Present Occas brief variable UC:   irregular SVE:   Dilation: 2 Effacement (%): 80 Station: -3 Exam by:: N.Aeon Kessner, CNM  Labs: PROTEIN / CREATININE RATIO, URINE     Status: None   Collection Time    11/05/13  7:50 AM      Result Value Range   Creatinine, Urine 52.67     Total Protein, Urine 5.2     Comment: NO NORMAL RANGE ESTABLISHED FOR THIS TEST   PROTEIN CREATININE RATIO 0.10  0.00 - 0.15   Comment: Performed at [facility]  CBC     Status: Abnormal   Collection Time    11/05/13  9:17 AM      Result Value Range   WBC 10.0  4.0 - 10.5 K/uL   RBC 3.41 (*) 3.87 - 5.11 MIL/uL   Hemoglobin 11.0 (*) 12.0 - 15.0 g/dL   HCT 31.4 (*) 36.0 - 46.0 %   MCV 92.1  78.0 - 100.0 fL   MCH 32.3  26.0 - 34.0 pg   MCHC 35.0  30.0 - 36.0 g/dL   RDW 12.6  11.5 - 15.5 %   Platelets 147 (*) 150 - 400 K/uL  RPR     Status: None   Collection Time    11/05/13  9:17 AM      Result Value Range   RPR NON REACTIVE  NON REACTIVE   Comment: Performed at [facility]     Status: Abnormal   Collection Time    11/05/13  9:17 AM      Result Value Range   Sodium 137  137 - 147 mEq/L   Potassium 4.2  3.7 - 5.3 mEq/L   Chloride 103  96 - 112 mEq/L   CO2 19  19 - 32 mEq/L   Glucose, Bld 100 (*) 70 - 99 mg/dL   BUN 9  6 - 23 mg/dL   Creatinine, Ser 0.76  0.50 - 1.10 mg/dL   Calcium 9.1  8.4 - 10.5 mg/dL   Total Protein 6.0  6.0 - 8.3 g/dL   Albumin 3.0 (*) 3.5 - 5.2 g/dL   AST 29  0 - 37 U/L   ALT 26  0 - 35 U/L   Alkaline  Phosphatase 113  39 - 117 U/L   Total Bilirubin 0.2 (*) 0.3 - 1.2 mg/dL   GFR calc non Af Amer >90  >90 mL/min   GFR calc Af Amer >90  >90 mL/min   Comment: (NOTE)     The eGFR has been calculated using the CKD EPI equation.     This calculation has not been validated in all clinical situations.     eGFR's persistently <90 mL/min signify possible Chronic Kidney     Disease.  LACTATE DEHYDROGENASE     Status: None   Collection Time    11/05/13  9:17 AM      Result Value Range   LDH 200  94 -  250 U/L  URIC ACID     Status: None   Collection Time    11/05/13  9:17 AM      Result Value Range   Uric Acid, Serum 6.9  2.4 - 7.0 mg/dL  TYPE AND SCREEN     Status: None   Collection Time    11/05/13  9:17 AM      Result Value Range   ABO/RH(D) O NEG     Antibody Screen NEG     Sample Expiration 11/08/2013       Assessment / Plan: IUP at 40w 6d Gestational hypertension without evidence of preeclampsia  Labor: Latent labor Preeclampsia:  no signs or symptoms of toxicity Fetal Wellbeing:  Category II Pain Control:  Labor support without medications I/D:  GBS neg/Afebrile Anticipated MOD:  NSVD  Pitocin started for augmentation of labor.  [place] O. 11/05/2013, 11:35 AM

## 2013-11-06 NOTE — Progress Notes (Signed)
Karen Bishop is a 23 y.o. G1P0 at 3751w6d   Subjective: Reports contractions have increased in intensity.  Currently has to breath with UCs but coping well.    Objective: BP 145/100  Pulse 85  Temp(Src) 97.8 F (36.6 C) (Oral)  Resp 20  Ht 5\' 1"  (1.549 m)  Wt 88.905 kg (196 lb)  BMI 37.05 kg/m2  LMP 01/23/2013  FHT:  FHR: 135 bpm, variability: moderate,  accelerations:  Present,  decelerations:  Present Occas brief variable with quick return to baseline. UC:   regular, every 3-6 minutes.  Pitocin at 485mu/min SVE:   Deferred at present  Assessment / Plan: Protracted latent phase IUP at 40w 6d  Labor: Pitocin augmentation Preeclampsia:  no signs or symptoms of toxicity Fetal Wellbeing:  Category II Pain Control:  Labor support without medications I/D:  Neg GBS/Afebrile Anticipated MOD:  NSVD  Continue to titrate pitocin to achieve regular contraction pattern.   Karen Divirgilio O. 11/05/2013, 2:05 PM

## 2013-11-07 ENCOUNTER — Ambulatory Visit: Payer: Self-pay

## 2013-11-07 MED ORDER — IBUPROFEN 600 MG PO TABS: 600.0000 mg | ORAL_TABLET | Freq: Four times a day (QID) | ORAL | Status: AC

## 2013-11-07 MED ORDER — POLYSACCHARIDE IRON COMPLEX 150 MG PO CAPS: 150.0000 mg | ORAL_CAPSULE | Freq: Every day | ORAL | Status: AC

## 2013-11-07 NOTE — Lactation Note (Signed)
This note was copied from the chart of [place]. Lactation Consultation Note  Patient Name: Karen Bishop ULAGT'X Date: 11/07/2013 Reason for consult: Follow-up assessment Per mom baby has been feeding on the right breast , and I'm hearing a lot swallows. Still having challenges on the left . LC assessed the latch on the right while the baby was latched in  [employer] , multiply swallows noted , increased with breast compressions. Baby released when done . Due to challenging tissue for latching on the left breast ( semi compress able areolas, when compresses, nipple inverts)  Resized mom for a nipple shield , #20 NS to small , #24 NS fits better , some room at the base, but ok - when baby gets a  Deep latch and is sucking will be able to pull the nipple into the nipple shield. After breast massage , hand express, the areola  Of the right breast soften and baby able to latch on for 5- 6 strong sucks , with a few swallows. Instructed mom on the use shells, Reverse pressure exercise. Mom has a hand pump for [facility], and moms [plans to call [facility] for an Appointment. Per mom isn't active , LC offered her a rental pump, and mom declined and plans to use hand pump  For now . DEBP kit sent with mom. F/U apt for 11/14/2013 Monday at 1930 pm with lactation , mom aware. Also  LC mentioned to mom to call this week for possible cancellation.    Maternal Data Has patient been taught Hand Expression?: Yes  Feeding Feeding Type:  (baby already latche don the right side ) Length of feed: 30 min (LC noted a swallowing pattern )  LATCH Score/Interventions Latch:  (latched with depth ) Intervention(s): Skin to skin  Audible Swallowing:  (noted multiply swallows )  Type of Nipple:  (swollen areolas , semi compress able , niplle appeared normal when baby released )  Comfort (Breast/Nipple): Soft / non-tender     Hold (Positioning): No assistance needed to correctly position infant at  breast. Intervention(s): Breastfeeding basics reviewed  LATCH Score: 9  Lactation Tools Discussed/Used Tools: Pump;Shells Nipple shield size:  (no nipple shiled needed on the right breast for latch ) Shell Type: Inverted Breast pump type: Double-Electric Breast Pump (already has manual , will add DEBP for [facility] loaner ) WIC Program: No (per mom plans to call , but has not been active with [facility] )   Consult Status Consult Status: Follow-up Date: 11/07/13 Follow-up type: In-patient    Myer Haff 11/07/2013, 11:20 AM

## 2013-11-07 NOTE — Discharge Summary (Signed)
Vaginal Delivery Discharge Summary  Karen Bishop  DOB:    1991/08/02 MRN:    [id number] CSN:    [id number]  Date of admission:                  11/05/13  Date of discharge:                   11/06/13  Procedures this admission: svd , pih LABS - NORMAL   Date of Delivery: 11/05/16   Newborn Data:  Live born female  Birth Weight: 7 lb 10 oz (3459 g) APGAR: 8, 9  Home with mother.  Circumcision Plan: OP circ   History of Present Illness:  Karen Bishop is a 23 y.o. female, G1P1001, who presents at 5910w6d weeks gestation. [facility]. She was admitted onset of labor. Her pregnancy has been complicated by: none.  Hospital course:  The patient was admitted for .   Her labor was not complicated. She proceeded to have a vaginal delivery of a healthy infant. Her delivery was not complicated. Her postpartum course was not complicated.  She was discharged to home on postpartum day 1 doing well.  Feeding:  breast  Contraception:   undecided   Discharge hemoglobin:  Hemoglobin  Date Value Range Status  11/06/2013 9.4* 12.0 - 15.0 g/dL Final     HCT  Date Value Range Status  11/06/2013 26.9* 36.0 - 46.0 % Final    Discharge Physical Exam:   General: alert and no distress Lochia: appropriate Uterine Fundus: firm Incision: healing well DVT Evaluation: No evidence of DVT seen on physical exam. Negative Homan's sign. No cords or calf tenderness. No significant calf/ankle edema.  Intrapartum Procedures: spontaneous vaginal delivery Postpartum Procedures: none Complications-Operative and Postpartum: none  Discharge Diagnoses: Term Pregnancy-delivered  Discharge Information:  Activity:           pelvic rest Diet:                routine Medications: Tylenol #3 and Percocet Condition:      stable Instructions:   Postpartum Care After Vaginal  Delivery  After you deliver your newborn (postpartum period), the usual stay in the hospital is 24 72 hours. If there were problems with your labor or delivery, or if you have other medical problems, you might be in the hospital longer.  While you are in the hospital, you will receive help and instructions on how to care for yourself and your newborn during the postpartum period.  While you are in the hospital:  Be sure to tell your nurses if you have pain or discomfort, as well as where you feel the pain and what makes the pain worse.  If you had an incision made near your vagina (episiotomy) or if you had some tearing during delivery, the nurses may put ice packs on your episiotomy or tear. The ice packs may help to reduce the pain and swelling.  If you are breastfeeding, you may feel uncomfortable contractions of your uterus for a couple of weeks. This is normal. The contractions help your uterus get back to normal size.  It is normal to have some bleeding after delivery.  For the first 1 3 days after delivery, the flow is red and the amount may be similar to a period.  It is common for the flow to start and stop.  In the  first few days, you may pass some small clots. Let your nurses know if you begin to pass large clots or your flow increases.  Do not  flush blood clots down the toilet before having the nurse look at them.  During the next 3 10 days after delivery, your flow should become more watery and pink or brown-tinged in color.  Ten to fourteen days after delivery, your flow should be a small amount of yellowish-white discharge.  The amount of your flow will decrease over the first few weeks after delivery. Your flow may stop in 6 8 weeks. Most women have had their flow stop by 12 weeks after delivery.  You should change your sanitary pads frequently.  Wash your hands thoroughly with soap and water for at least 20 seconds after changing pads, using the toilet, or before  holding or feeding your newborn.  You should feel like you need to empty your bladder within the first 6 8 hours after delivery.  In case you become weak, lightheaded, or faint, call your nurse before you get out of bed for the first time and before you take a shower for the first time.  Within the first few days after delivery, your breasts may begin to feel tender and full. This is called engorgement. Breast tenderness usually goes away within 48 72 hours after engorgement occurs. You may also notice milk leaking from your breasts. If you are not breastfeeding, do not stimulate your breasts. Breast stimulation can make your breasts produce more milk.  Spending as much time as possible with your newborn is very important. During this time, you and your newborn can feel close and get to know each other. Having your newborn stay in your room (rooming in) will help to strengthen the bond with your newborn. It will give you time to get to know your newborn and become comfortable caring for your newborn.  Your hormones change after delivery. Sometimes the hormone changes can temporarily cause you to feel sad or tearful. These feelings should not last more than a few days. If these feelings last longer than that, you should talk to your caregiver.  If desired, talk to your caregiver about methods of family planning or contraception.  Talk to your caregiver about immunizations. Your caregiver may want you to have the following immunizations before leaving the hospital:  Tetanus, diphtheria, and pertussis (Tdap) or tetanus and diphtheria (Td) immunization. It is very important that you and your family (including grandparents) or others caring for your newborn are up-to-date with the Tdap or Td immunizations. The Tdap or Td immunization can help protect your newborn from getting ill.  Rubella immunization.  Varicella (chickenpox) immunization.  Influenza immunization. You should receive this annual  immunization if you did not receive the immunization during your pregnancy. Document Released: 08/03/2007 Document Revised: 06/30/2012 Document Reviewed: 06/02/2012 [facility] Patient Information 2014 [place], [place].   Postpartum Depression and Baby Blues  The postpartum period begins right after the birth of a baby. During this time, there is often a great amount of joy and excitement. It is also a time of considerable changes in the life of the parent(s). Regardless of how many times a mother gives birth, each child brings new challenges and dynamics to the family. It is not unusual to have feelings of excitement accompanied by confusing shifts in moods, emotions, and thoughts. All mothers are at risk of developing postpartum depression or the "baby blues." These mood changes can occur right after giving  birth, or they may occur many months after giving birth. The baby blues or postpartum depression can be mild or severe. Additionally, postpartum depression can resolve rather quickly, or it can be a long-term condition. CAUSES Elevated hormones and their rapid decline are thought to be a main cause of postpartum depression and the baby blues. There are a number of hormones that radically change during and after pregnancy. Estrogen and progesterone usually decrease immediately after delivering your baby. The level of thyroid hormone and various cortisol steroids also rapidly drop. Other factors that play a major role in these changes include major life events and genetics.  RISK FACTORS If you have any of the following risks for the baby blues or postpartum depression, know what symptoms to watch out for during the postpartum period. Risk factors that may increase the likelihood of getting the baby blues or postpartum depression include:  Havinga personal or family history of depression.  Having depression while being pregnant.  Having premenstrual or oral contraceptive-associated mood  issues.  Having exceptional life stress.  Having marital conflict.  Lacking a social support network.  Having a baby with special needs.  Having health problems such as diabetes. SYMPTOMS Baby blues symptoms include:  Brief fluctuations in mood, such as going from extreme happiness to sadness.  Decreased concentration.  Difficulty sleeping.  Crying spells, tearfulness.  Irritability.  Anxiety. Postpartum depression symptoms typically begin within the first month after giving birth. These symptoms include:  Difficulty sleeping or excessive sleepiness.  Marked weight loss.  Agitation.  Feelings of worthlessness.  Lack of interest in activity or food. Postpartum psychosis is a very concerning condition and can be dangerous. Fortunately, it is rare. Displaying any of the following symptoms is cause for immediate medical attention. Postpartum psychosis symptoms include:  Hallucinations and delusions.  Bizarre or disorganized behavior.  Confusion or disorientation. DIAGNOSIS  A diagnosis is made by an evaluation of your symptoms. There are no medical or lab tests that lead to a diagnosis, but there are various questionnaires that a caregiver may use to identify those with the baby blues, postpartum depression, or psychosis. Often times, a screening tool called the [place] Postnatal Depression Scale is used to diagnose depression in the postpartum period.  TREATMENT The baby blues usually goes away on its own in 1 to 2 weeks. Social support is often all that is needed. You should be encouraged to get adequate sleep and rest. Occasionally, you may be given medicines to help you sleep.  Postpartum depression requires treatment as it can last several months or longer if it is not treated. Treatment may include individual or group therapy, medicine, or both to address any social, physiological, and psychological factors that may play a role in the depression. Regular exercise, a  healthy diet, rest, and social support may also be strongly recommended.  Postpartum psychosis is more serious and needs treatment right away. Hospitalization is often needed. HOME CARE INSTRUCTIONS  Get as much rest as you can. Nap when the baby sleeps.  Exercise regularly. Some women find yoga and walking to be beneficial.  Eat a balanced and nourishing diet.  Do little things that you enjoy. Have a cup of tea, take a bubble bath, read your favorite magazine, or listen to your favorite music.  Avoid alcohol.  Ask for help with household chores, cooking, grocery shopping, or running errands as needed. Do not try to do everything.  Talk to people close to you about how you are feeling. Get  support from your partner, family members, friends, or other new moms.  Try to stay positive in how you think. Think about the things you are grateful for.  Do not spend a lot of time alone.  Only take medicine as directed by your caregiver.  Keep all your postpartum appointments.  Let your caregiver know if you have any concerns. SEEK MEDICAL CARE IF: You are having a reaction or problems with your medicine. SEEK IMMEDIATE MEDICAL CARE IF:  You have suicidal feelings.  You feel you may harm the baby or someone else. Document Released: 07/10/2004 Document Revised: 12/29/2011 Document Reviewed: 08/12/2011 [facility] Patient Information 2014 [place], [place].   Discharge to: home  Follow-up Information   Follow up with [facility]. Call in 6 weeks.   Specialty:  Obstetrics and Gynecology   Contact information:   [address] [place] [address] [phone]       Malissa Hippo 11/07/2013

## 2013-11-07 NOTE — Progress Notes (Signed)
UR chart review completed.  

## 2013-11-07 NOTE — Discharge Instructions (Signed)
Vaginal Delivery °Care After °Refer to this sheet in the next few weeks. These discharge instructions provide you with information on caring for yourself after delivery. Your caregiver may also give you specific instructions. Your treatment has been planned according to the most current medical practices available, but problems sometimes occur. Call your caregiver if you have any problems or questions after you go home. °HOME CARE INSTRUCTIONS °· Take over-the-counter or prescription medicines only as directed by your caregiver or pharmacist. °· Do not drink alcohol, especially if you are breastfeeding or taking medicine to relieve pain. °· Do not chew or smoke tobacco. °· Do not use illegal drugs. °· Continue to use good perineal care. Good perineal care includes: °· Wiping your perineum from front to back. °· Keeping your perineum clean. °· Do not use tampons or douche until your caregiver says it is okay. °· Shower, wash your hair, and take tub baths as directed by your caregiver. °· Wear a well-fitting bra that provides breast support. °· Eat healthy foods. °· Drink enough fluids to keep your urine clear or pale yellow. °· Eat high-fiber foods such as whole grain cereals and breads, brown rice, beans, and fresh fruits and vegetables every day. These foods may help prevent or relieve constipation. °· Follow your cargiver's recommendations regarding resumption of activities such as climbing stairs, driving, lifting, exercising, or traveling. °· Talk to your caregiver about resuming sexual activities. Resumption of sexual activities is dependent upon your risk of infection, your rate of healing, and your comfort and desire to resume sexual activity. °· Try to have someone help you with your household activities and your newborn for at least a few days after you leave the hospital. °· Rest as much as possible. Try to rest or take a nap when your newborn is sleeping. °· Increase your activities gradually. °· Keep all  of your scheduled postpartum appointments. It is very important to keep your scheduled follow-up appointments. At these appointments, your caregiver will be checking to make sure that you are healing physically and emotionally. °SEEK MEDICAL CARE IF:  °· You are passing large clots from your vagina. Save any clots to show your caregiver. °· You have a foul smelling discharge from your vagina. °· You have trouble urinating. °· You are urinating frequently. °· You have pain when you urinate. °· You have a change in your bowel movements. °· You have increasing redness, pain, or swelling near your vaginal incision (episiotomy) or vaginal tear. °· You have pus draining from your episiotomy or vaginal tear. °· Your episiotomy or vaginal tear is separating. °· You have painful, hard, or reddened breasts. °· You have a severe headache. °· You have blurred vision or see spots. °· You feel sad or depressed. °· You have thoughts of hurting yourself or your newborn. °· You have questions about your care, the care of your newborn, or medicines. °· You are dizzy or lightheaded. °· You have a rash. °· You have nausea or vomiting. °· You were breastfeeding and have not had a menstrual period within 12 weeks after you stopped breastfeeding. °· You are not breastfeeding and have not had a menstrual period by the 12th week after delivery. °· You have a fever. °SEEK IMMEDIATE MEDICAL CARE IF:  °· You have persistent pain. °· You have chest pain. °· You have shortness of breath. °· You faint. °· You have leg pain. °· You have stomach pain. °· Your vaginal bleeding saturates two or more sanitary pads   in 1 hour. °MAKE SURE YOU:  °· Understand these instructions. °· Will watch your condition. °· Will get help right away if you are not doing well or get worse. °Document Released: 10/03/2000 Document Revised: 06/30/2012 Document Reviewed: 06/02/2012 °[employer]® Patient Information ©2014 [employer]. ° °Breastfeeding °Deciding to breastfeed  is one of the best choices you can make for you and your baby. A change in hormones during pregnancy causes your breast tissue to grow and increases the number and size of your milk ducts. These hormones also allow proteins, sugars, and fats from your blood supply to make breast milk in your milk-producing glands. Hormones prevent breast milk from being released before your baby is born as well as prompt milk flow after birth. Once breastfeeding has begun, thoughts of your baby, as well as his or her sucking or crying, can stimulate the release of milk from your milk-producing glands.  °BENEFITS OF BREASTFEEDING °For Your Baby °· Your first milk (colostrum) helps your baby's digestive system function better.   °· There are antibodies in your milk that help your baby fight off infections.   °· Your baby has a lower incidence of asthma, allergies, and sudden infant death syndrome.   °· The nutrients in breast milk are better for your baby than infant formulas and are designed uniquely for your baby's needs.   °· Breast milk improves your baby's brain development.   °· Your baby is less likely to develop other conditions, such as childhood obesity, asthma, or type 2 diabetes mellitus.   °For You  °· Breastfeeding helps to create a very special bond between you and your baby.   °· Breastfeeding is convenient. Breast milk is always available at the correct temperature and costs nothing.   °· Breastfeeding helps to burn calories and helps you lose the weight gained during pregnancy.   °· Breastfeeding makes your uterus contract to its prepregnancy size faster and slows bleeding (lochia) after you give birth.   °· Breastfeeding helps to lower your risk of developing type 2 diabetes mellitus, osteoporosis, and breast or ovarian cancer later in life. °SIGNS THAT YOUR BABY IS HUNGRY °Early Signs of Hunger  °· Increased alertness or activity. °· Stretching. °· Movement of the head from side to side. °· Movement of the head and  opening of the mouth when the corner of the mouth or cheek is stroked (rooting). °· Increased sucking sounds, smacking lips, cooing, sighing, or squeaking. °· Hand-to-mouth movements. °· Increased sucking of fingers or hands. °Late Signs of Hunger °· Fussing. °· Intermittent crying. °Extreme Signs of Hunger °Signs of extreme hunger will require calming and consoling before your baby will be able to breastfeed successfully. Do not wait for the following signs of extreme hunger to occur before you initiate breastfeeding:   °· Restlessness. °· A loud, strong cry. °·  Screaming. °BREASTFEEDING BASICS °Breastfeeding Initiation °· Find a comfortable place to sit or lie down, with your neck and back well supported. °· Place a pillow or rolled up blanket under your baby to bring him or her to the level of your breast (if you are seated). Nursing pillows are specially designed to help support your arms and your baby while you breastfeed. °· Make sure that your baby's abdomen is facing your abdomen.   °· Gently massage your breast. With your fingertips, massage from your chest wall toward your nipple in a circular motion. This encourages milk flow. You may need to continue this action during the feeding if your milk flows slowly. °· Support your breast with 4 fingers underneath   and your thumb above your nipple. Make sure your fingers are well away from your nipple and your baby's mouth.   °· Stroke your baby's lips gently with your finger or nipple.   °· When your baby's mouth is open wide enough, quickly bring your baby to your breast, placing your entire nipple and as much of the colored area around your nipple (areola) as possible into your baby's mouth.   °· More areola should be visible above your baby's upper lip than below the lower lip.   °· Your baby's tongue should be between his or her lower gum and your breast.   °· Ensure that your baby's mouth is correctly positioned around your nipple (latched). Your baby's  lips should create a seal on your breast and be turned out (everted). °· It is common for your baby to suck about 2 3 minutes in order to start the flow of breast milk. °Latching °Teaching your baby how to latch on to your breast properly is very important. An improper latch can cause nipple pain and decreased milk supply for you and poor weight gain in your baby. Also, if your baby is not latched onto your nipple properly, he or she may swallow some air during feeding. This can make your baby fussy. Burping your baby when you switch breasts during the feeding can help to get rid of the air. However, teaching your baby to latch on properly is still the best way to prevent fussiness from swallowing air while breastfeeding. °Signs that your baby has successfully latched on to your nipple:    °· Silent tugging or silent sucking, without causing you pain.   °· Swallowing heard between every 3 4 sucks.   °·  Muscle movement above and in front of his or her ears while sucking.   °Signs that your baby has not successfully latched on to nipple:  °· Sucking sounds or smacking sounds from your baby while breastfeeding. °· Nipple pain. °If you think your baby has not latched on correctly, slip your finger into the corner of your baby's mouth to break the suction and place it between your baby's gums. Attempt breastfeeding initiation again. °Signs of Successful Breastfeeding °Signs from your baby:   °· A gradual decrease in the number of sucks or complete cessation of sucking.   °· Falling asleep.   °· Relaxation of his or her body.   °· Retention of a small amount of milk in his or her mouth.   °· Letting go of your breast by himself or herself. °Signs from you: °· Breasts that have increased in firmness, weight, and size 1 3 hours after feeding.   °· Breasts that are softer immediately after breastfeeding. °· Increased milk volume, as well as a change in milk consistency and color by the 5th day of breastfeeding.   °· Nipples  that are not sore, cracked, or bleeding. °Signs That Your Baby is Getting Enough Milk °· Wetting at least 3 diapers in a 24-hour period. The urine should be clear and pale yellow by age 5 days. °· At least 3 stools in a 24-hour period by age 5 days. The stool should be soft and yellow. °· At least 3 stools in a 24-hour period by age 7 days. The stool should be seedy and yellow. °· No loss of weight greater than 10% of birth weight during the first 3 days of age. °· Average weight gain of 4 7 ounces (120 210 mL) per week after age 4 days. °· Consistent daily weight gain by age 5 days, without weight loss   after the age of 2 weeks. °After a feeding, your baby may spit up a small amount. This is common. °BREASTFEEDING FREQUENCY AND DURATION °Frequent feeding will help you make more milk and can prevent sore nipples and breast engorgement. Breastfeed when you feel the need to reduce the fullness of your breasts or when your baby shows signs of hunger. This is called "breastfeeding on demand." Avoid introducing a pacifier to your baby while you are working to establish breastfeeding (the first 4 6 weeks after your baby is born). After this time you may choose to use a pacifier. Research has shown that pacifier use during the first year of a baby's life decreases the risk of sudden infant death syndrome (SIDS). °Allow your baby to feed on each breast as long as he or she wants. Breastfeed until your baby is finished feeding. When your baby unlatches or falls asleep while feeding from the first breast, offer the second breast. Because newborns are often sleepy in the first few weeks of life, you may need to awaken your baby to get him or her to feed. °Breastfeeding times will vary from baby to baby. However, the following rules can serve as a guide to help you ensure that your baby is properly fed: °· Newborns (babies 4 weeks of age or younger) may breastfeed every 1 3 hours. °· Newborns should not go longer than 3 hours  during the day or 5 hours during the night without breastfeeding. °· You should breastfeed your baby a minimum of 8 times in a 24-hour period until you begin to introduce solid foods to your baby at around 6 months of age. °BREAST MILK PUMPING °Pumping and storing breast milk allows you to ensure that your baby is exclusively fed your breast milk, even at times when you are unable to breastfeed. This is especially important if you are going back to work while you are still breastfeeding or when you are not able to be present during feedings. Your lactation consultant can give you guidelines on how long it is safe to store breast milk.  °A breast pump is a machine that allows you to pump milk from your breast into a sterile bottle. The pumped breast milk can then be stored in a refrigerator or freezer. Some breast pumps are operated by hand, while others use electricity. Ask your lactation consultant which type will work best for you. Breast pumps can be purchased, but some hospitals and breastfeeding support groups lease breast pumps on a monthly basis. A lactation consultant can teach you how to hand express breast milk, if you prefer not to use a pump.  °CARING FOR YOUR BREASTS WHILE YOU BREASTFEED °Nipples can become dry, cracked, and sore while breastfeeding. The following recommendations can help keep your breasts moisturized and healthy: °· Avoid using soap on your nipples.   °· Wear a supportive bra. Although not required, special nursing bras and tank tops are designed to allow access to your breasts for breastfeeding without taking off your entire bra or top. Avoid wearing underwire style bras or extremely tight bras. °· Air dry your nipples for 3 4 minutes after each feeding.   °· Use only cotton bra pads to absorb leaked breast milk. Leaking of breast milk between feedings is normal.   °· Use lanolin on your nipples after breastfeeding. Lanolin helps to maintain your skin's normal moisture barrier. If you  use pure lanolin you do not need to wash it off before feeding your baby again. Pure lanolin is not   toxic to your baby. You may also hand express a few drops of breast milk and gently massage that milk into your nipples and allow the milk to air dry. °In the first few weeks after giving birth, some women experience extremely full breasts (engorgement). Engorgement can make your breasts feel heavy, warm, and tender to the touch. Engorgement peaks within 3 5 days after you give birth. The following recommendations can help ease engorgement: °· Completely empty your breasts while breastfeeding or pumping. You may want to start by applying warm, moist heat (in the shower or with warm water-soaked hand towels) just before feeding or pumping. This increases circulation and helps the milk flow. If your baby does not completely empty your breasts while breastfeeding, pump any extra milk after he or she is finished. °· Wear a snug bra (nursing or regular) or tank top for 1 2 days to signal your body to slightly decrease milk production. °· Apply ice packs to your breasts, unless this is too uncomfortable for you. °· Make sure that your baby is latched on and positioned properly while breastfeeding. °If engorgement persists after 48 hours of following these recommendations, contact your health care provider or a lactation consultant. °OVERALL HEALTH CARE RECOMMENDATIONS WHILE BREASTFEEDING °· Eat healthy foods. Alternate between meals and snacks, eating 3 of each per day. Because what you eat affects your breast milk, some of the foods may make your baby more irritable than usual. Avoid eating these foods if you are sure that they are negatively affecting your baby. °· Drink milk, fruit juice, and water to satisfy your thirst (about 10 glasses a day).   °· Rest often, relax, and continue to take your prenatal vitamins to prevent fatigue, stress, and anemia. °· Continue breast self-awareness checks. °· Avoid chewing and  smoking tobacco. °· Avoid alcohol and drug use. °Some medicines that may be harmful to your baby can pass through breast milk. It is important to ask your health care provider before taking any medicine, including all over-the-counter and prescription medicine as well as vitamin and herbal supplements. °It is possible to become pregnant while breastfeeding. If birth control is desired, ask your health care provider about options that will be safe for your baby. °SEEK MEDICAL CARE IF:  °· You feel like you want to stop breastfeeding or have become frustrated with breastfeeding. °· You have painful breasts or nipples. °· Your nipples are cracked or bleeding. °· Your breasts are red, tender, or warm. °· You have a swollen area on either breast. °· You have a fever or chills. °· You have nausea or vomiting. °· You have drainage other than breast milk from your nipples. °· Your breasts do not become full before feedings by the 5th day after you give birth. °· You feel sad and depressed. °· Your baby is too sleepy to eat well. °· Your baby is having trouble sleeping.   °· Your baby is wetting less than 3 diapers in a 24-hour period. °· Your baby has less than 3 stools in a 24-hour period. °· Your baby's skin or the white part of his or her eyes becomes yellow.   °· Your baby is not gaining weight by 5 days of age. °SEEK IMMEDIATE MEDICAL CARE IF:  °· Your baby is overly tired (lethargic) and does not want to wake up and feed. °· Your baby develops an unexplained fever. °Document Released: 10/06/2005 Document Revised: 06/08/2013 Document Reviewed: 03/30/2013 °[employer]® Patient Information ©2014 [employer]. ° °Postpartum Depression and Baby   Blues °The postpartum period begins right after the birth of a baby. During this time, there is often a great amount of joy and excitement. It is also a time of considerable changes in the life of the parent(s). Regardless of how many times a mother gives birth, each child brings new  challenges and dynamics to the family. It is not unusual to have feelings of excitement accompanied by confusing shifts in moods, emotions, and thoughts. All mothers are at risk of developing postpartum depression or the "baby blues." These mood changes can occur right after giving birth, or they may occur many months after giving birth. The baby blues or postpartum depression can be mild or severe. Additionally, postpartum depression can resolve rather quickly, or it can be a long-term condition. °CAUSES °Elevated hormones and their rapid decline are thought to be a main cause of postpartum depression and the baby blues. There are a number of hormones that radically change during and after pregnancy. Estrogen and progesterone usually decrease immediately after delivering your baby. The level of thyroid hormone and various cortisol steroids also rapidly drop. Other factors that play a major role in these changes include major life events and genetics.  °RISK FACTORS °If you have any of the following risks for the baby blues or postpartum depression, know what symptoms to watch out for during the postpartum period. Risk factors that may increase the likelihood of getting the baby blues or postpartum depression include: °· Having a personal or family history of depression. °· Having depression while being pregnant. °· Having premenstrual or oral contraceptive-associated mood issues. °· Having exceptional life stress. °· Having marital conflict. °· Lacking a social support network. °· Having a baby with special needs. °· Having health problems such as diabetes. °SYMPTOMS °Baby blues symptoms include: °· Brief fluctuations in mood, such as going from extreme happiness to sadness. °· Decreased concentration. °· Difficulty sleeping. °· Crying spells, tearfulness. °· Irritability. °· Anxiety. °Postpartum depression symptoms typically begin within the first month after giving birth. These symptoms include: °· Difficulty  sleeping or excessive sleepiness. °· Marked weight loss. °· Agitation. °· Feelings of worthlessness. °· Lack of interest in activity or food. °Postpartum psychosis is a very concerning condition and can be dangerous. Fortunately, it is rare. Displaying any of the following symptoms is cause for immediate medical attention. Postpartum psychosis symptoms include: °· Hallucinations and delusions. °· Bizarre or disorganized behavior. °· Confusion or disorientation. °DIAGNOSIS  °A diagnosis is made by an evaluation of your symptoms. There are no medical or lab tests that lead to a diagnosis, but there are various questionnaires that a caregiver may use to identify those with the baby blues, postpartum depression, or psychosis. Often times, a screening tool called the Edinburgh Postnatal Depression Scale is used to diagnose depression in the postpartum period.  °TREATMENT °The baby blues usually goes away on its own in 1 to 2 weeks. Social support is often all that is needed. You should be encouraged to get adequate sleep and rest. Occasionally, you may be given medicines to help you sleep.  °Postpartum depression requires treatment as it can last several months or longer if it is not treated. Treatment may include individual or group therapy, medicine, or both to address any social, physiological, and psychological factors that may play a role in the depression. Regular exercise, a healthy diet, rest, and social support may also be strongly recommended.  °Postpartum psychosis is more serious and needs treatment right away. Hospitalization is often needed. °HOME   CARE INSTRUCTIONS °· Get as much rest as you can. Nap when the baby sleeps. °· Exercise regularly. Some women find yoga and walking to be beneficial. °· Eat a balanced and nourishing diet. °· Do little things that you enjoy. Have a cup of tea, take a bubble bath, read your favorite magazine, or listen to your favorite music. °· Avoid alcohol. °· Ask for help with  household chores, cooking, grocery shopping, or running errands as needed. Do not try to do everything. °· Talk to people close to you about how you are feeling. Get support from your partner, family members, friends, or other new moms. °· Try to stay positive in how you think. Think about the things you are grateful for. °· Do not spend a lot of time alone. °· Only take medicine as directed by your caregiver. °· Keep all your postpartum appointments. °· Let your caregiver know if you have any concerns. °SEEK MEDICAL CARE IF: °You are having a reaction or problems with your medicine. °SEEK IMMEDIATE MEDICAL CARE IF: °· You have suicidal feelings. °· You feel you may harm the baby or someone else. °Document Released: 07/10/2004 Document Revised: 12/29/2011 Document Reviewed: 07/18/2013 °[employer]® Patient Information ©2014 [employer]. ° °

## 2013-11-10 ENCOUNTER — Ambulatory Visit: Payer: Self-pay

## 2013-11-10 NOTE — Lactation Note (Signed)
This note was copied from the chart of Karen Bishop. I Lactation Consultation Outpatient Visit Note: Follow up visit with mom who had difficulty with latch on left breast. Was using NS on left breast in hospital but has been able to get baby latched to breast without it  Patient Name: Karen LikensFelix Demas Date of Birth: 11/05/2013 Birth Weight:  7 lb 10 oz (3459 g) Gestational Age at Delivery: Gestational Age: [id number] Type of Delivery:   Breastfeeding History Frequency of Breastfeeding:  q 3 hours Length of Feeding: 30-45 min Voids: QS- had void while here for appointment Stools: QS had yellow stool while here for appointment  Supplementing / Method: When too full- has been pumping about 5 minutes to soften Pumping:  Type of Pump:   Frequency:  Volume:    Comments:    Consultation Evaluation:  Initial Feeding Assessment: Pre-feed Weight: 7- 7.1  3376 Post-feed Weight: 7-8.8  3424g Amount Transferred: 48 cc's Comments: Latched Felix to left breast without NS. He latched easily- mom doing great job with positioning him. He nursed for 14 minutes. Lots of swallows heard and mom reports that breast feels softer   Additional Feeding Assessment: Pre-feed Weight: 7-8.8  3424g Post-feed Weight: 7- 11.3  3496g Amount Transferred: 72 cc's Comments: Rulon EisenmengerFelix latched easily to right breast and nursed for 15 minutes. Content after nursing and mom reports that breast feels softer.   Total Breast milk Transferred this Visit: 120 cc's Total Supplement Given: 0  Additional Interventions: Questions answered about how to know if baby is getting enough- reviewed content after nursing, breast softens, diaper changes and weight gain as signs that he is getting enough. No further questions at this time. Praise given.    Follow-Up With Ped To call here with questions/concerns or if needs another appointment    Pamelia HoitWeeks, Yizel Canby D 11/10/2013, 4:57 PM

## 2014-08-21 ENCOUNTER — Encounter (HOSPITAL_COMMUNITY): Payer: Self-pay | Admitting: *Deleted

## 2015-09-20 IMAGING — US US FETAL BPP W/O NONSTRESS
1 series · 13 of 28 positions shown · non-contrast
Comparison: none

[Series 1: us fetal bpp w/o nonstress · non-contrast · 35 acquisitions, 13 frames shown]
[im 2/35]
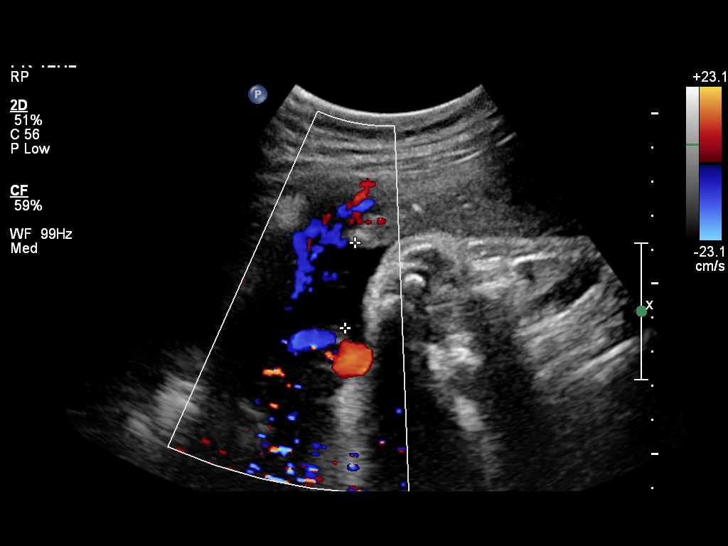
[im 4/35]
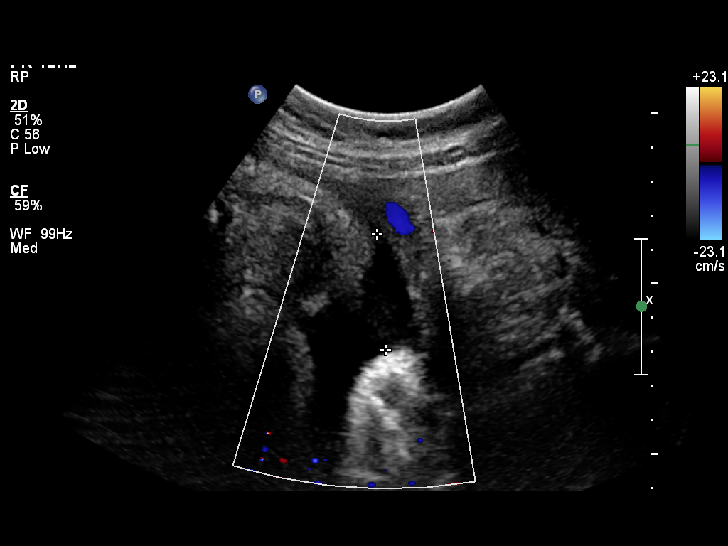
[im 7/35]
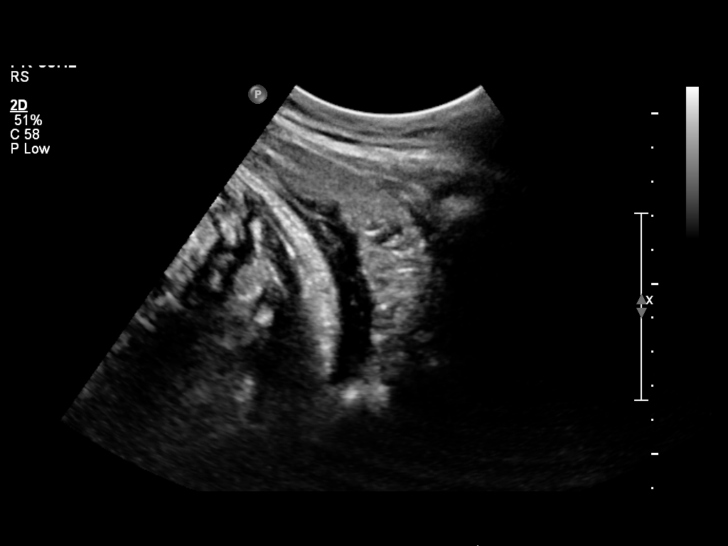
[im 9/35]
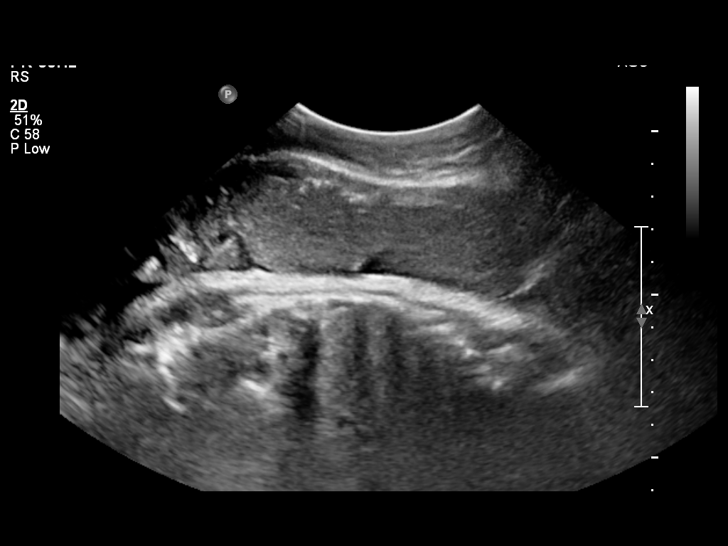
[im 12/35]
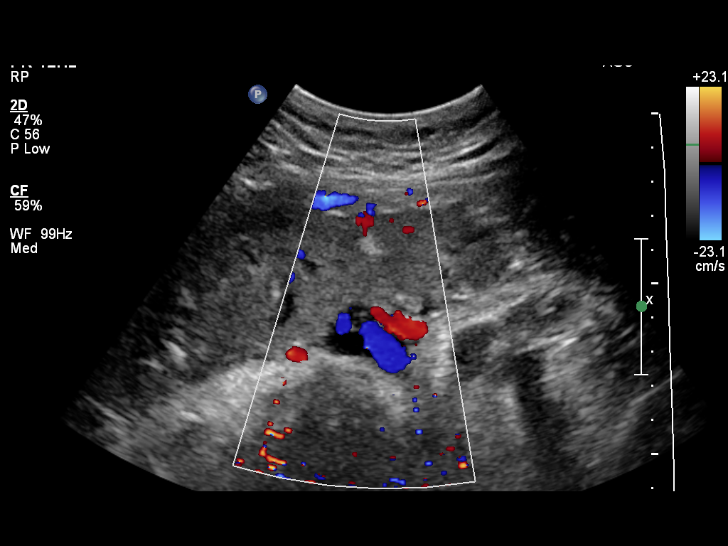
[im 14/35]
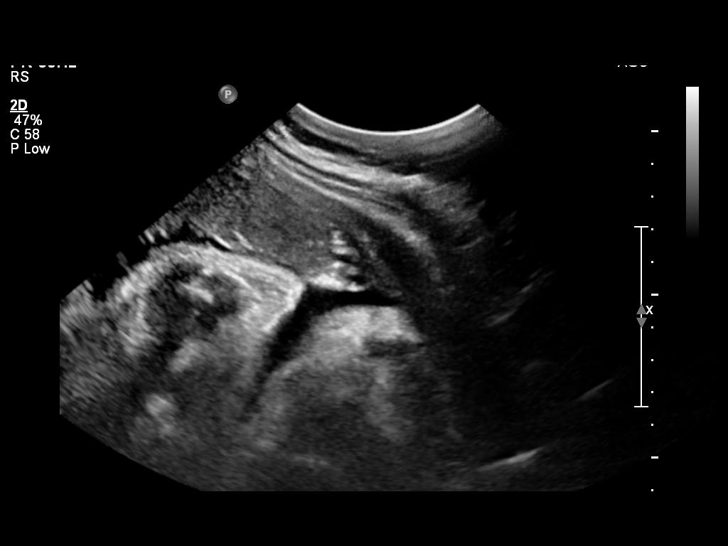
[im 18/35]
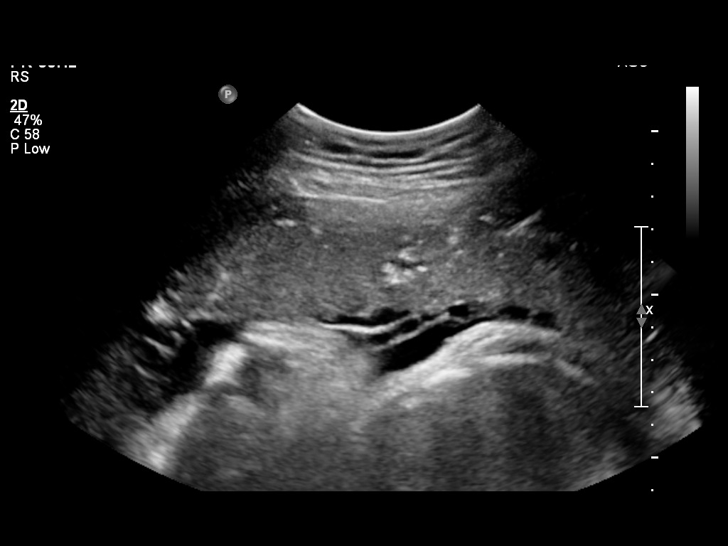
[im 21/35]
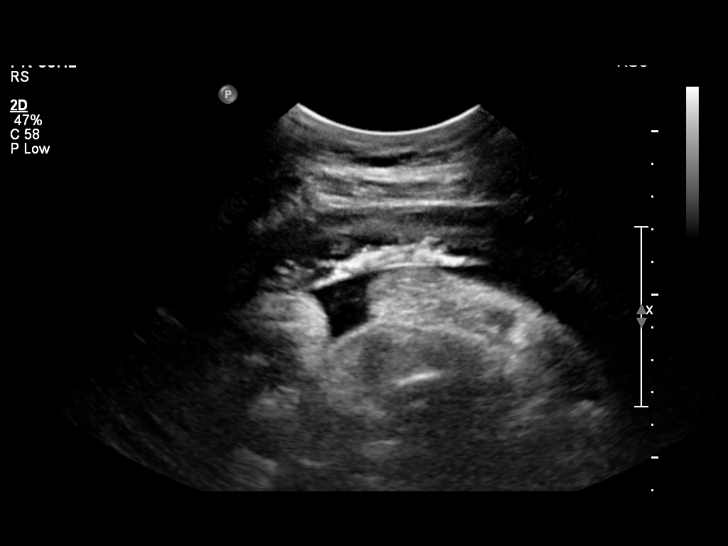
[im 23/35]
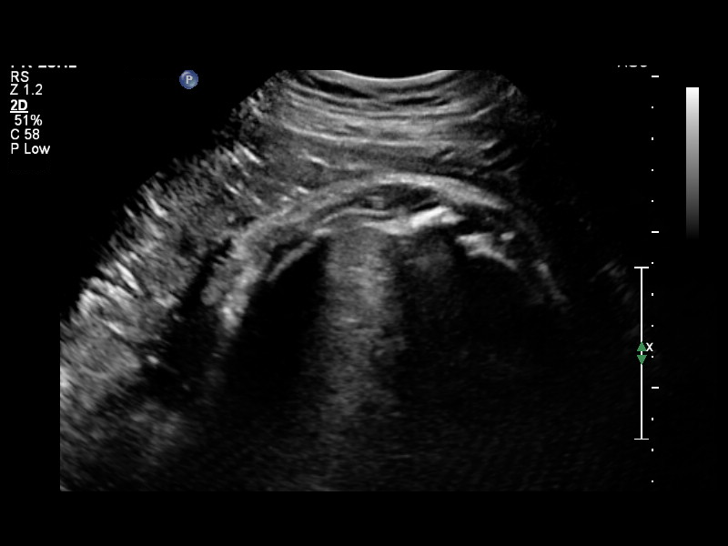
[im 26/35]
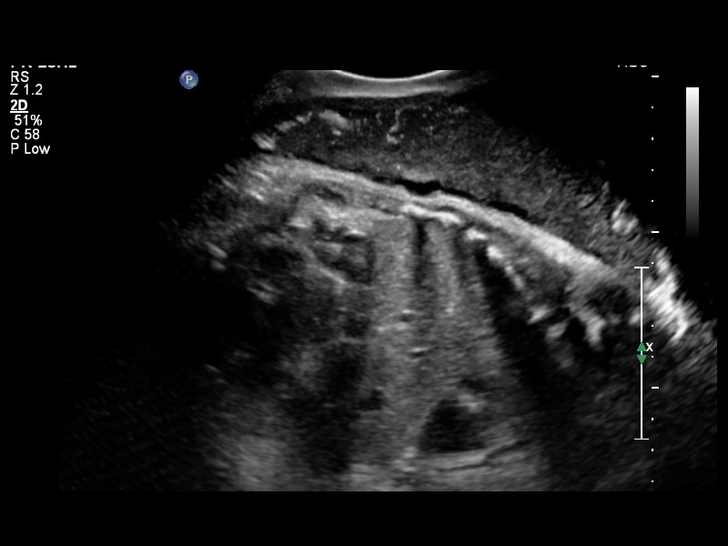
[im 28/35]
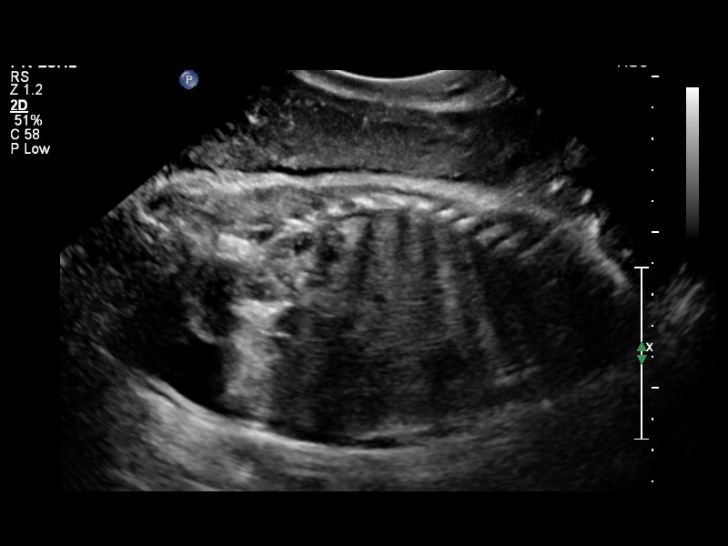
[im 31/35]
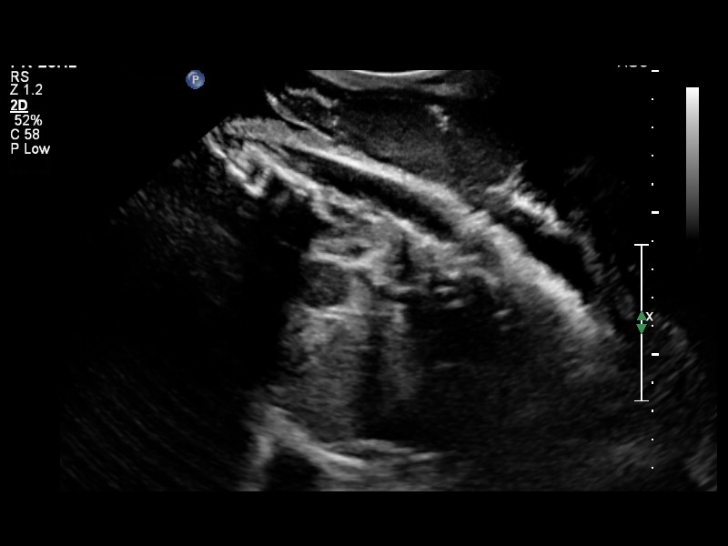
[im 33/35]
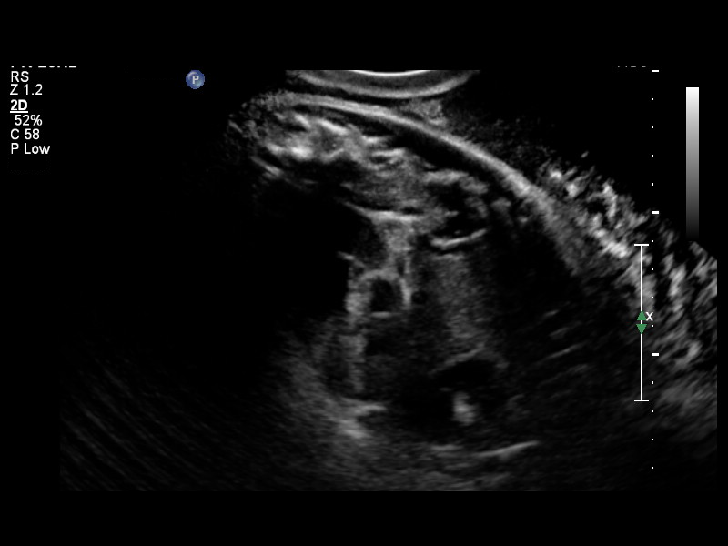

[13 of 28 positions shown; findings below may reference images not displayed]

OBSTETRICS REPORT
                      (Signed Final 11/07/2013 [DATE])

Service(s) Provided

 [HOSPITAL]                                         76815.0
Indications

 Postdate pregnancy (40-42 weeks)
 Abnormal fetal heart rate/rhythm
Fetal Evaluation

 Num Of Fetuses:    1
 Fetal Heart Rate:  121                          bpm
 Cardiac Activity:  Observed
 Presentation:      Cephalic
 Placenta:          Anterior, above cervical os
 P. Cord            Visualized, central
 Insertion:

 Comment:    BPP [DATE] in 29 minutes

 Amniotic Fluid
 AFI FV:      Subjectively within normal limits
 AFI Sum:     12.85    cm      56  %Tile      Larg Pckt:   4.43  cm
 RUQ:   4.43    cm   RLQ:    2.51   cm    LUQ:    3.4    cm    LLQ:   2.51    cm
Biophysical Evaluation

 Amniotic F.V:   Within normal limits       F. Tone:         Observed
 F. Movement:    Observed                   Score:           [DATE]
 F. Breathing:   Observed
Gestational Age

 Clinical EDD:  40w 6d                                        EDD:    10/30/13
 Best:          40w 6d     Det. By:  Clinical EDD             EDD:    10/30/13
Cervix Uterus Adnexa

 Cervix:       Not visualized (advanced GA >34 wks)
 Uterus:       No abnormality visualized.
 Cul De Sac:   No free fluid seen.
 Left Ovary:    Not visualized.
 Right Ovary:   Not visualized.

 Adnexa:     No abnormality visualized.
Impression

 Single living intrauterine pregnancy at 40 weeks 6 days.
 Normal amniotic fluid volume.
 BPP [DATE].
Recommendations

 Follow-up ultrasounds as clinically indicated.

                Thach, Iverson
